# Patient Record
Sex: Male | Born: 1935 | Race: White | Hispanic: No | Smoking: Former smoker
Health system: Southern US, Community
[De-identification: ages and names within clinical notes are randomized; demographics above are authoritative.]

## PROBLEM LIST (undated history)

## (undated) DIAGNOSIS — I4891 Unspecified atrial fibrillation: Secondary | ICD-10-CM

## (undated) DIAGNOSIS — G20 Parkinson's disease: Secondary | ICD-10-CM

## (undated) DIAGNOSIS — K219 Gastro-esophageal reflux disease without esophagitis: Secondary | ICD-10-CM

## (undated) DIAGNOSIS — G459 Transient cerebral ischemic attack, unspecified: Secondary | ICD-10-CM

---

## 2014-11-09 LAB — COMPREHENSIVE METABOLIC PANEL
ANION GAP: 6 — AB (ref 7–16)
AST: 37 U/L (ref 15–37)
Albumin: 3.7 g/dL (ref 3.4–5.0)
Alkaline Phosphatase: 92 U/L
BUN: 22 mg/dL — ABNORMAL HIGH (ref 7–18)
Bilirubin,Total: 0.6 mg/dL (ref 0.2–1.0)
CALCIUM: 8.6 mg/dL (ref 8.5–10.1)
Chloride: 111 mmol/L — ABNORMAL HIGH (ref 98–107)
Co2: 23 mmol/L (ref 21–32)
Creatinine: 1.15 mg/dL (ref 0.60–1.30)
EGFR (African American): >60
EGFR (Non-African Amer.): >60
Glucose: 124 mg/dL — ABNORMAL HIGH (ref 65–99)
Osmolality: 284 (ref 275–301)
POTASSIUM: 4.9 mmol/L (ref 3.5–5.1)
SGPT (ALT): 13 U/L — ABNORMAL LOW
SODIUM: 140 mmol/L (ref 136–145)
Total Protein: 7.5 g/dL (ref 6.4–8.2)

## 2014-11-09 LAB — CBC
HCT: 45.7 % (ref 40.0–52.0)
HGB: 14.8 g/dL (ref 13.0–18.0)
MCH: 30.4 pg (ref 26.0–34.0)
MCHC: 32.4 g/dL (ref 32.0–36.0)
MCV: 94 fL (ref 80–100)
Platelet: 199 10*3/uL (ref 150–440)
RBC: 4.88 10*6/uL (ref 4.40–5.90)
RDW: 14.2 % (ref 11.5–14.5)
WBC: 9.2 10*3/uL (ref 3.8–10.6)

## 2014-11-09 LAB — URINALYSIS, COMPLETE
BLOOD: NEGATIVE
Bacteria: NONE SEEN
Bilirubin,UR: NEGATIVE
Glucose,UR: NEGATIVE mg/dL (ref 0–75)
LEUKOCYTE ESTERASE: NEGATIVE
Nitrite: NEGATIVE
PH: 5 (ref 4.5–8.0)
Protein: NEGATIVE
RBC,UR: 2 /HPF (ref 0–5)
Specific Gravity: 1.025 (ref 1.003–1.030)
WBC UR: 1 /HPF (ref 0–5)

## 2014-11-09 LAB — TROPONIN I

## 2014-11-10 LAB — CK-MB
CK-MB: 2.2 ng/mL (ref 0.5–3.6)
CK-MB: 2 ng/mL (ref 0.5–3.6)
CK-MB: 1.8 ng/mL (ref 0.5–3.6)

## 2014-11-10 LAB — TROPONIN I

## 2014-11-10 LAB — TSH: THYROID STIMULATING HORM: 2.4 u[IU]/mL

## 2014-11-11 ENCOUNTER — Inpatient Hospital Stay: Payer: Self-pay | Admitting: Internal Medicine

## 2014-11-11 LAB — COMPREHENSIVE METABOLIC PANEL
ALK PHOS: 83 U/L (ref 46–116)
Albumin: 3.5 g/dL (ref 3.4–5.0)
Anion Gap: 3 — ABNORMAL LOW (ref 7–16)
BILIRUBIN TOTAL: 0.7 mg/dL (ref 0.2–1.0)
BUN: 23 mg/dL — AB (ref 7–18)
CHLORIDE: 110 mmol/L — AB (ref 98–107)
CO2: 27 mmol/L (ref 21–32)
Calcium, Total: 8.8 mg/dL (ref 8.5–10.1)
Creatinine: 1.16 mg/dL (ref 0.60–1.30)
EGFR (Non-African Amer.): >60
Glucose: 90 mg/dL (ref 65–99)
Osmolality: 283 (ref 275–301)
POTASSIUM: 3.8 mmol/L (ref 3.5–5.1)
SGOT(AST): 31 U/L (ref 15–37)
SGPT (ALT): 11 U/L — ABNORMAL LOW (ref 14–63)
SODIUM: 140 mmol/L (ref 136–145)
TOTAL PROTEIN: 7 g/dL (ref 6.4–8.2)

## 2014-11-11 LAB — CBC WITH DIFFERENTIAL/PLATELET
Basophil #: 0 10*3/uL
Basophil %: 0.5 %
Eosinophil #: 0.1 10*3/uL
Eosinophil %: 1.7 %
HCT: 44.5 %
HGB: 14.2 g/dL
Lymphocyte %: 18.6 %
Lymphs Abs: 1.4 10*3/uL
MCH: 29.7 pg
MCHC: 31.9 g/dL — ABNORMAL LOW
MCV: 93 fL
Monocyte #: 0.6 10*3/uL
Monocyte %: 8.1 %
Neutrophil #: 5.3 10*3/uL
Neutrophil %: 71.1 %
Platelet: 182 10*3/uL
RBC: 4.77 x10 6/mm 3
RDW: 14.3 %
WBC: 7.4 10*3/uL

## 2014-11-11 LAB — MAGNESIUM: Magnesium: 2.1 mg/dL

## 2014-12-22 ENCOUNTER — Encounter
Admit: 2014-12-22 | Disposition: A | Discharge status: Home / Self Care | Payer: Self-pay | Attending: Internal Medicine | Admitting: Internal Medicine

## 2015-01-16 ENCOUNTER — Encounter
Admit: 2015-01-16 | Disposition: A | Discharge status: Home / Self Care | Payer: Self-pay | Attending: Internal Medicine | Admitting: Internal Medicine

## 2015-02-03 ENCOUNTER — Emergency Department
Admit: 2015-02-03 | Disposition: A | Discharge status: Home / Self Care | Payer: Self-pay | Admitting: Emergency Medicine

## 2015-02-03 LAB — CBC WITH DIFFERENTIAL/PLATELET
BASOS PCT: 0.4 %
Basophil #: 0 10*3/uL (ref 0.0–0.1)
EOS ABS: 0.1 10*3/uL (ref 0.0–0.7)
EOS PCT: 1.1 %
HCT: 43.5 % (ref 40.0–52.0)
HGB: 14.4 g/dL (ref 13.0–18.0)
Lymphocyte #: 0.8 10*3/uL — ABNORMAL LOW (ref 1.0–3.6)
Lymphocyte %: 10.3 %
MCH: 31 pg (ref 26.0–34.0)
MCHC: 33.2 g/dL (ref 32.0–36.0)
MCV: 93 fL (ref 80–100)
Monocyte #: 0.6 x10 3/mm (ref 0.2–1.0)
Monocyte %: 7.7 %
NEUTROS ABS: 5.9 10*3/uL (ref 1.4–6.5)
Neutrophil %: 80.5 %
Platelet: 186 10*3/uL (ref 150–440)
RBC: 4.66 10*6/uL (ref 4.40–5.90)
RDW: 16.3 % — AB (ref 11.5–14.5)
WBC: 7.3 10*3/uL (ref 3.8–10.6)

## 2015-02-03 LAB — BASIC METABOLIC PANEL
ANION GAP: 6 — AB (ref 7–16)
BUN: 20 mg/dL
CALCIUM: 8.5 mg/dL — AB
CHLORIDE: 104 mmol/L
Co2: 25 mmol/L
Creatinine: 0.85 mg/dL
EGFR (African American): >60
EGFR (Non-African Amer.): >60
GLUCOSE: 96 mg/dL
POTASSIUM: 3.9 mmol/L
SODIUM: 135 mmol/L

## 2015-02-03 LAB — URINALYSIS, COMPLETE
BACTERIA: NONE SEEN
BILIRUBIN, UR: NEGATIVE
BLOOD: NEGATIVE
Glucose,UR: NEGATIVE mg/dL (ref 0–75)
Leukocyte Esterase: NEGATIVE
Nitrite: NEGATIVE
Ph: 6 (ref 4.5–8.0)
SPECIFIC GRAVITY: 1.023 (ref 1.003–1.030)

## 2015-02-03 LAB — TROPONIN I: Troponin-I: <0.03 ng/mL

## 2015-02-03 LAB — MAGNESIUM: Magnesium: 2.1 mg/dL

## 2015-02-15 NOTE — H&P (Signed)
PATIENT NAME:  Jon Mills, Jon Mills MR#:  [id number] DATE OF BIRTH:  08-Sep-1931  DATE OF ADMISSION:  11/09/2014  PRIMARY CARE PHYSICIAN:  Jon Lope. Judithann Sheen, MD  REFERRING PHYSICIAN:  Kathreen Devoid. Paduchowski, MD  CHIEF COMPLAINT:  Altered mental status, slurred speech.  HISTORY OF PRESENT ILLNESS:  Jon Mills is an 81 year old male with known history of Parkinson disease on medications, who was doing well in his usual state of health until this afternoon, when the patient's daughter noticed him to be confused with some slurred speech. These symptoms gradually worsened over the course of the day. Concerning this, the patient's daughter brought him to the Emergency Department. Workup in the Emergency Department:  CT of the head without contrast is negative. The patient's symptoms have completely resolved. He is currently oriented x 3. The patient was recently seen by the neurologist and was noted to have atrial fibrillation, which was rate controlled at that time. The patient was recommended to follow up with the cardiologist as an outpatient. Today, the patient is found to have an atrial fibrillation with a rapid ventricular rate with heart rate around 110 to 120s. As per the patient's daughter, the patient has been experiencing mild shortness of breath lately with mild increased swelling in the lower extremities. The patient denied having any chest pain and denied any weakness in any part of the body.   PAST MEDICAL HISTORY:  Parkinson disease.  PAST SURGICAL HISTORY:  Hemorrhoidectomy.  ALLERGIES:  No known drug allergies.  HOME MEDICATIONS:   1.  Sinemet 25/250 mg 5 times a day. 2.  Mirapex ER 1.5 mg once a day. 3.  Comtan 200 mg 5 times a day. 4.  Aleve 25/220 mg 2 tablets once at bedtime.  SOCIAL HISTORY:  No history of smoking, drinking alcohol, or using illicit drugs. Currently lives in daughter's mother-in-law suite, which is connected to her house. Independent of ADLs.   FAMILY HISTORY:   Noncontributory at the age of 65.  REVIEW OF SYSTEMS: CONSTITUTIONAL:  Denies any generalized weakness. EYES:  No change in vision. EARS, NOSE, AND THROAT:  No change in hearing. RESPIRATORY:  No cough or shortness of breath. CARDIOVASCULAR:  No chest pain or palpitations. GASTROINTESTINAL:  No nausea or vomiting. GENITOURINARY:  No dysuria or hematuria. HEMATOLOGIC:  No easy bruising or bleeding. SKIN:  No rash or lesions. MUSCULOSKELETAL:  No joint pains and aches. NEUROLOGIC:  No weakness or numbness in any part of the body.  PHYSICAL EXAMINATION: GENERAL:  This is a well-built, well-nourished, age-appropriate male lying down in the bed, not in distress. VITAL SIGNS:  Temperature 98.2, pulse 115, blood pressure 155/99, respiratory rate 18, oxygen saturation 99% on room air. HEENT:  Head is normocephalic and atraumatic. There is no scleral icterus. Conjunctivae are normal. Pupils are equal, round, and react to light. Mucous membranes are moist. No pharyngeal erythema. NECK:  Supple. No lymphadenopathy. No JVD. No carotid bruit. CHEST:  Has no focal tenderness.  LUNGS:  Bilaterally clear to auscultation. HEART:  S1 and S2, regular. No murmurs are heard. ABDOMEN:  Bowel sounds present. Soft, nontender, nondistended. No hepatosplenomegaly.  EXTREMITIES:  Trace pitting edema around the ankles. Pulses 2+. NEUROLOGIC:  The patient is alert and oriented to place, person, and time. Cranial nerves II through XII are intact. Motor is 5/5 in upper and lower extremities.  LABORATORY DATA:  UA is negative for nitrites and leukocyte esterase.  CBC and BMP are completely within normal limits. Troponins are negative.  ASSESSMENT AND PLAN:  Jon Mills is an 81 year old male with known history of Parkinson disease, who comes with confusion and some slurred speech, which has been resolved at this time.  1.  Altered mental status, differential diagnosis transient ischemic attack versus arrhythmias.  Admit the patient to a monitored bed. Will obtain MRI of the brain in the morning, carotid Dopplers, and echocardiogram.  2.  Atrial fibrillation with rapid ventricular rate. Will obtain TSH and echocardiogram. I will keep the patient on metoprolol 25 mg b.i.d. 3.  Parkinson disease. Will continue the Sinemet. 4.  Keep the patient on deep venous thrombosis prophylaxis with Lovenox.  TIME SPENT:  50 minutes.   ____________________________ Jon GriffinsPadmaja Travez Stancil, MD pv:nb D: 11/10/2014 00:27:36 ET T: 11/10/2014 00:57:56 ET JOB#: [id number]  cc: Jon GriffinsPadmaja Yaneliz Radebaugh, MD, <Dictator> Jon GriffinsPADMAJA Rose-Marie Hickling MD ELECTRONICALLY SIGNED 11/11/2014 4:33

## 2015-02-15 NOTE — Consult Note (Signed)
PATIENT NAME:  Jon GreavesVEROSKO, Deionte F MR#:  [id number] DATE OF BIRTH:  [birth year]  DATE OF CONSULTATION:  11/10/2014  REFERRING PHYSICIAN:   CONSULTING PHYSICIAN:  Lamar BlinksBruce J. Folashade Gamboa, MD  CONSULTING PHYSICIAN:  Aram BeechamJeffrey Sparks, MD.    REASON FOR CONSULTATION: Atrial fibrillation with rapid ventricular rate.   CHIEF COMPLAINT:    dizzy.    HISTORY OF PRESENT ILLNESS:  This is an 81 year old male with known mild dementia and Parkinson's, who has not had any cardiovascular disease history. The patient has had new onset of dizziness and weakness for which he has had some confusion as well worse than before, The patient was seen in the Emergency Room for this issue, for which he had an EKG showing atrial fibrillation with rapid ventricular rate and nonspecific ST changes and some preventricular contractions. He has had some medications for treatment of this including metoprolol and Cardizem, for which his heart rate is 93 beats per minute and still irregular. He has not had any chest pain or other heart failure-type symptoms with a normal troponin and no evidence of EKG changes consistent with myocardial infarction. The patient has had an echocardiogram showing mild global LV systolic dysfunction most consistent with atrial fibrillation with rapid ventricular rate and some mild to moderate valvular heart disease. The patient is feeling somewhat better without evidence of further symptoms. He is ambulating well and no current concerns.   REVIEW OF SYSTEMS: The remainder review of systems difficult due to Parkinson's and mild dementia.   PAST MEDICAL HISTORY:  1.  Parkinsonism.  2.  Dementia.   FAMILY HISTORY: No family members with early onset of cardiovascular disease or hypertension.   SOCIAL HISTORY: Currently denies alcohol or tobacco use.   ALLERGIES: AS LISTED.   MEDICATIONS: As listed.   PHYSICAL EXAMINATION:  VITAL SIGNS: Blood pressure is 122/68 bilaterally, heart rate is 93 upright,  reclining, and irregular.  GENERAL: He is a well-appearing male in no acute distress.  HEAD, EYES, EARS, NOSE, AND THROAT: No icterus, thyromegaly, ulcers, hemorrhage, or xanthelasma.  CARDIOVASCULAR: Irregularly irregular with normal S1 and S2 with no murmur, gallop, or rub. PMI is inferiorly displaced. Carotid upstroke normal without bruit. Jugular venous pressure is normal.  LUNGS: Clear to auscultation with normal respirations.  ABDOMEN: Soft, nontender, without hepatosplenomegaly or masses. Abdominal aorta is normal size without bruit.  EXTREMITIES: 2 + radial, femoral, dorsal pedal pulses, with trace lower extremity edema. No cyanosis, clubbing, or ulcers.  NEUROLOGIC: He appears to be oriented.   ASSESSMENT: An 81 year old male with new onset atrial fibrillation with rapid ventricular rate, now controlled, nonvalvular in nature, with mild global left ventricular systolic dysfunction and nonrheumatic valvular heart disease, needing further treatment options.   RECOMMENDATIONS:  1.  Continue combination of Cardizem and metoprolol for heart rate control of atrial fibrillation with a goal heart rate at rest between 60-90 beats per minute.  2.  Begin anticoagulation with Eliquis 5 mg twice per day for further risk reduction in stroke, with further discussion with the patient about the risks and benefits of anticoagulation and bleeding risks. The patient does not have any apparent significant bleeding risks at this time and his fall risk is minimal. The family agrees with this treatment.  3.  Continue ambulation, following for any further significant symptoms.  4.  Continue metoprolol for mild LV systolic dysfunction most consistent with atrial fibrillation with rapid ventricular rate.  5.  Okay for discharge to home with followup thereafter with adjustments of medications.  ____________________________ Lamar Blinks, MD bjk:bu D: 11/10/2014 17:03:21 ET T: 11/10/2014 17:20:46  ET JOB#: [id number]  cc: Lamar Blinks, MD, <Dictator> Lamar Blinks MD ELECTRONICALLY SIGNED 11/14/2014 8:36

## 2015-02-15 NOTE — Discharge Summary (Signed)
PATIENT NAME:  Jon Mills, Jon Mills MR#:  [id number] DATE OF BIRTH:  Jun 11, 1931  DATE OF ADMISSION:  11/11/2014 DATE OF DISCHARGE:  11/12/2014  REASON FOR ADMISSION: Rapid atrial fibrillation with altered mental status and slurred speech.   HISTORY OF PRESENT ILLNESS: Please see the dictated HPI done by Dr. Heron NayVasireddy on 11/10/2014.   PAST MEDICAL HISTORY:  1.  Parkinson disease.  2.  Status post hemorrhoidectomy.   MEDICATIONS ON ADMISSION: Please see admission note.   ALLERGIES: No known drug allergies.   SOCIAL HISTORY: As per admission note.   FAMILY HISTORY: As per admission note.   REVIEW OF SYSTEMS: As per admission note.   PHYSICAL EXAMINATION:  GENERAL: The patient was in no acute distress.  VITAL SIGNS: Stable and he was afebrile.  HEENT: Unremarkable.  NECK: Supple without JVD.  LUNGS: Clear.  CARDIAC: Regular rate and rhythm with a normal S1, S2.  ABDOMEN: Soft and nontender.  EXTREMITIES: Reveal trace pitting edema.  NEUROLOGIC: Grossly nonfocal.   HOSPITAL COURSE: The patient was admitted with altered mental status with possible TIA. He was also noted to have transient atrial fibrillation with a rapid ventricular response. Echocardiogram, TSH, and cardiac enzymes were unremarkable. He was seen in consultation by cardiology who recommended conservative therapy. He was placed on blood thinners because of the atrial fibrillation. MRI of the brain was unremarkable. He was felt to have a TIA. He was seen by physical therapy and care management who recommended home health. The family agreed. The patient's rate was well controlled and he was ready for discharge on 11/12/2014.   DISCHARGE DIAGNOSES:  1.  Rapid atrial fibrillation.  2.  Transient ischemic attack.  3.  Benign hypertension.  4.  Parkinson disease.   DISCHARGE MEDICATIONS:  1.  Mirapex ER 1.5 mg p.o. daily.  2.  Sinemet 25/250 one p.o. t.i.d.  3.  Entacapone 200 mg p.o. 5 times daily. 4.  Norco 10/325 one  p.o. q. 4 hours p.r.n. pain.  5.  Imdur 30 mg p.o. b.i.d.  6.  Eliquis 5 mg p.o. b.i.d.  7.  Lopressor 50 mg p.o. b.i.d.  8.  Cardizem CD 180 mg p.o. daily.  9.  Protonix 40 mg p.o. b.i.d.   FOLLOW UP PLANS AND APPOINTMENTS: The patient was discharged on a low sodium diet. He will be followed by home health. He will follow up with me within 1 week's time, sooner if needed.     ____________________________ Duane LopeJeffrey D. Judithann SheenSparks, MD jds:TT D: 11/18/2014 17:15:12 ET T: 11/18/2014 21:25:50 ET JOB#: [id number]  cc: Duane LopeJeffrey D. Judithann SheenSparks, MD, <Dictator> Maybell Misenheimer Rodena Medin Malakhai Beitler MD ELECTRONICALLY SIGNED 11/19/2014 8:12

## 2015-07-02 ENCOUNTER — Emergency Department: Payer: Medicare Other

## 2015-07-02 ENCOUNTER — Observation Stay
Admission: EM | Admit: 2015-07-02 | Discharge: 2015-07-03 | Disposition: A | Discharge status: Home / Self Care | Payer: Medicare Other | Attending: Internal Medicine | Admitting: Internal Medicine

## 2015-07-02 DIAGNOSIS — G459 Transient cerebral ischemic attack, unspecified: Principal | ICD-10-CM | POA: Insufficient documentation

## 2015-07-02 DIAGNOSIS — Z7901 Long term (current) use of anticoagulants: Secondary | ICD-10-CM | POA: Insufficient documentation

## 2015-07-02 DIAGNOSIS — I482 Chronic atrial fibrillation: Secondary | ICD-10-CM | POA: Diagnosis not present

## 2015-07-02 DIAGNOSIS — I639 Cerebral infarction, unspecified: Secondary | ICD-10-CM

## 2015-07-02 DIAGNOSIS — R262 Difficulty in walking, not elsewhere classified: Secondary | ICD-10-CM | POA: Diagnosis not present

## 2015-07-02 DIAGNOSIS — R4701 Aphasia: Secondary | ICD-10-CM | POA: Insufficient documentation

## 2015-07-02 DIAGNOSIS — R4789 Other speech disturbances: Secondary | ICD-10-CM | POA: Insufficient documentation

## 2015-07-02 DIAGNOSIS — I340 Nonrheumatic mitral (valve) insufficiency: Secondary | ICD-10-CM | POA: Diagnosis not present

## 2015-07-02 DIAGNOSIS — R471 Dysarthria and anarthria: Secondary | ICD-10-CM | POA: Insufficient documentation

## 2015-07-02 DIAGNOSIS — Z87891 Personal history of nicotine dependence: Secondary | ICD-10-CM | POA: Diagnosis not present

## 2015-07-02 DIAGNOSIS — R4182 Altered mental status, unspecified: Secondary | ICD-10-CM | POA: Insufficient documentation

## 2015-07-02 DIAGNOSIS — Z8673 Personal history of transient ischemic attack (TIA), and cerebral infarction without residual deficits: Secondary | ICD-10-CM | POA: Insufficient documentation

## 2015-07-02 DIAGNOSIS — Z7982 Long term (current) use of aspirin: Secondary | ICD-10-CM | POA: Insufficient documentation

## 2015-07-02 DIAGNOSIS — Z79899 Other long term (current) drug therapy: Secondary | ICD-10-CM | POA: Diagnosis not present

## 2015-07-02 DIAGNOSIS — I10 Essential (primary) hypertension: Secondary | ICD-10-CM | POA: Diagnosis not present

## 2015-07-02 DIAGNOSIS — G20 Parkinson's disease: Secondary | ICD-10-CM | POA: Insufficient documentation

## 2015-07-02 DIAGNOSIS — I071 Rheumatic tricuspid insufficiency: Secondary | ICD-10-CM | POA: Diagnosis not present

## 2015-07-02 HISTORY — DX: Parkinson's disease: G20

## 2015-07-02 HISTORY — DX: Unspecified atrial fibrillation: I48.91

## 2015-07-02 HISTORY — DX: Transient cerebral ischemic attack, unspecified: G45.9

## 2015-07-02 LAB — COMPREHENSIVE METABOLIC PANEL WITH GFR
ALT: <5 U/L — ABNORMAL LOW (ref 17–63)
AST: 14 U/L — ABNORMAL LOW (ref 15–41)
Albumin: 3.9 g/dL (ref 3.5–5.0)
Alkaline Phosphatase: 113 U/L (ref 38–126)
Anion gap: 8 (ref 5–15)
BUN: 21 mg/dL — ABNORMAL HIGH (ref 6–20)
CO2: 27 mmol/L (ref 22–32)
Calcium: 9.1 mg/dL (ref 8.9–10.3)
Chloride: 102 mmol/L (ref 101–111)
Creatinine, Ser: 0.98 mg/dL (ref 0.61–1.24)
GFR calc Af Amer: >60 mL/min
GFR calc non Af Amer: >60 mL/min
Glucose, Bld: 161 mg/dL — ABNORMAL HIGH (ref 65–99)
Potassium: 4.1 mmol/L (ref 3.5–5.1)
Sodium: 137 mmol/L (ref 135–145)
Total Bilirubin: 0.5 mg/dL (ref 0.3–1.2)
Total Protein: 7.7 g/dL (ref 6.5–8.1)

## 2015-07-02 LAB — APTT: aPTT: 38 s — ABNORMAL HIGH (ref 24–36)

## 2015-07-02 LAB — CBC
HEMATOCRIT: 45.5 % (ref 40.0–52.0)
HEMOGLOBIN: 15.2 g/dL (ref 13.0–18.0)
MCH: 31 pg (ref 26.0–34.0)
MCHC: 33.3 g/dL (ref 32.0–36.0)
MCV: 93.2 fL (ref 80.0–100.0)
Platelets: 232 10*3/uL (ref 150–440)
RBC: 4.88 MIL/uL (ref 4.40–5.90)
RDW: 14.3 % (ref 11.5–14.5)
WBC: 8.6 10*3/uL (ref 3.8–10.6)

## 2015-07-02 LAB — PROTIME-INR
INR: 1.18
Prothrombin Time: 15.2 seconds — ABNORMAL HIGH (ref 11.4–15.0)

## 2015-07-02 LAB — DIFFERENTIAL
BASOS ABS: 0 10*3/uL (ref 0–0.1)
Basophils Relative: 0 %
EOS ABS: 0.1 10*3/uL (ref 0–0.7)
Eosinophils Relative: 2 %
LYMPHS ABS: 1.7 10*3/uL (ref 1.0–3.6)
LYMPHS PCT: 20 %
MONOS PCT: 9 %
Monocytes Absolute: 0.8 10*3/uL (ref 0.2–1.0)
NEUTROS ABS: 6 10*3/uL (ref 1.4–6.5)
Neutrophils Relative %: 69 %

## 2015-07-02 LAB — TROPONIN I: Troponin I: <0.03 ng/mL

## 2015-07-02 MED ORDER — METOPROLOL TARTRATE 25 MG PO TABS
25.0000 mg | ORAL_TABLET | Freq: Two times a day (BID) | ORAL | Status: DC
  Administered 2015-07-03 (×2): 25 mg via ORAL
  Filled 2015-07-02 (×2): qty 1

## 2015-07-02 MED ORDER — ACETAMINOPHEN 325 MG PO TABS: 650.0000 mg | ORAL_TABLET | Freq: Four times a day (QID) | ORAL | Status: DC | PRN

## 2015-07-02 MED ORDER — CARBIDOPA-LEVODOPA ER 25-100 MG PO TBCR
1.0000 | EXTENDED_RELEASE_TABLET | Freq: Every day | ORAL | Status: DC
  Administered 2015-07-03 (×2): 1 via ORAL
  Filled 2015-07-02 (×6): qty 1

## 2015-07-02 MED ORDER — APIXABAN 5 MG PO TABS
5.0000 mg | ORAL_TABLET | Freq: Two times a day (BID) | ORAL | Status: DC
  Administered 2015-07-03 (×2): 5 mg via ORAL
  Filled 2015-07-02 (×2): qty 1

## 2015-07-02 MED ORDER — DILTIAZEM HCL ER COATED BEADS 180 MG PO CP24
180.0000 mg | ORAL_CAPSULE | Freq: Every day | ORAL | Status: DC
  Administered 2015-07-03: 11:00:00 180 mg via ORAL
  Filled 2015-07-02: qty 1

## 2015-07-02 MED ORDER — CARBIDOPA-LEVODOPA ER 50-200 MG PO TBCR
1.0000 | EXTENDED_RELEASE_TABLET | Freq: Every day | ORAL | Status: DC
  Administered 2015-07-03: 03:00:00 1 via ORAL
  Filled 2015-07-02 (×2): qty 1

## 2015-07-02 MED ORDER — DOCUSATE SODIUM 100 MG PO CAPS
100.0000 mg | ORAL_CAPSULE | Freq: Two times a day (BID) | ORAL | Status: DC
  Administered 2015-07-03 (×2): 100 mg via ORAL
  Filled 2015-07-02 (×2): qty 1

## 2015-07-02 MED ORDER — QUETIAPINE FUMARATE 25 MG PO TABS
12.5000 mg | ORAL_TABLET | Freq: Every day | ORAL | Status: DC
  Administered 2015-07-03: 25 mg via ORAL
  Filled 2015-07-02: qty 1

## 2015-07-02 MED ORDER — DIPHENHYDRAMINE-APAP (SLEEP) 25-500 MG PO TABS: 1.0000 | ORAL_TABLET | Freq: Every evening | ORAL | Status: DC | PRN

## 2015-07-02 MED ORDER — ACETAMINOPHEN 500 MG PO TABS: 500.0000 mg | ORAL_TABLET | Freq: Every evening | ORAL | Status: DC | PRN

## 2015-07-02 MED ORDER — CARBIDOPA-LEVODOPA 25-250 MG PO TABS
1.0000 | ORAL_TABLET | Freq: Every day | ORAL | Status: DC
  Administered 2015-07-03 (×2): 1 via ORAL
  Filled 2015-07-02 (×6): qty 1

## 2015-07-02 MED ORDER — ACETAMINOPHEN 650 MG RE SUPP: 650.0000 mg | Freq: Four times a day (QID) | RECTAL | Status: DC | PRN

## 2015-07-02 MED ORDER — ASPIRIN EC 325 MG PO TBEC
325.0000 mg | DELAYED_RELEASE_TABLET | Freq: Every day | ORAL | Status: DC
  Administered 2015-07-02 – 2015-07-03 (×2): 325 mg via ORAL
  Filled 2015-07-02 (×2): qty 1

## 2015-07-02 MED ORDER — CARBIDOPA-LEVODOPA ER 50-200 MG PO TBCR
2.0000 | EXTENDED_RELEASE_TABLET | Freq: Every day | ORAL | Status: DC
  Administered 2015-07-03: 01:00:00 2 via ORAL
  Filled 2015-07-02 (×2): qty 2

## 2015-07-02 MED ORDER — SODIUM CHLORIDE 0.9 % IJ SOLN
3.0000 mL | Freq: Two times a day (BID) | INTRAMUSCULAR | Status: DC
  Administered 2015-07-03: 01:00:00 3 mL via INTRAVENOUS

## 2015-07-02 MED ORDER — ONDANSETRON HCL 4 MG PO TABS: 4.0000 mg | ORAL_TABLET | Freq: Four times a day (QID) | ORAL | Status: DC | PRN

## 2015-07-02 MED ORDER — DIPHENHYDRAMINE HCL 25 MG PO CAPS: 25.0000 mg | ORAL_CAPSULE | Freq: Every evening | ORAL | Status: DC | PRN

## 2015-07-02 MED ORDER — ONDANSETRON HCL 4 MG/2ML IJ SOLN: 4.0000 mg | Freq: Four times a day (QID) | INTRAMUSCULAR | Status: DC | PRN

## 2015-07-02 NOTE — ED Notes (Signed)
Pt sitting up in chair at bedside. Family remains at bedside. NAD noted. RR even and nonlabored. Will continue to monitor.

## 2015-07-02 NOTE — ED Provider Notes (Signed)
[facility] Emergency Department Provider Note  ____________________________________________  Time seen: Approximately 8:54 PM  I have reviewed the triage vital signs and the nursing notes.   HISTORY  Chief Complaint Altered Mental Status and Code Stroke    HPI Jon Mills is a 81 y.o. male daughter reports she was talking to her father today at 6:30. He became acutely unable to answer questions except for by saying yes or no. On the way here he began being able to respond appropriately again.   No past medical history on file.  past medical history significant for Parkinson's and high blood pressure and atrial fibrillation  There are no active problems to display for this patient.   No past surgical history on file.  Current Outpatient Rx  Name  Route  Sig  Dispense  Refill  . apixaban (ELIQUIS) 5 MG TABS tablet   Oral   Take 5 mg by mouth 2 (two) times daily.         . carbidopa-levodopa (SINEMET CR) 50-200 MG per tablet   Oral   Take 2-3 tablets by mouth at bedtime.         . carbidopa-levodopa (SINEMET IR) 25-250 MG per tablet   Oral   Take 1 tablet by mouth 5 (five) times daily. May take an additional 1/2 at bedtime if needed         . Carbidopa-Levodopa ER (SINEMET CR) 25-100 MG tablet controlled release   Oral   Take 1 tablet by mouth 5 (five) times daily. This is to be taken with the 25-250 dose         . diltiazem (TIAZAC) 180 MG 24 hr capsule   Oral   Take 180 mg by mouth daily.         . diphenhydramine-acetaminophen (TYLENOL PM) 25-500 MG TABS   Oral   Take 1 tablet by mouth at bedtime as needed.         . metoprolol tartrate (LOPRESSOR) 25 MG tablet   Oral   Take 25 mg by mouth 2 (two) times daily.         . QUEtiapine (SEROQUEL) 25 MG tablet   Oral   Take 12.5-25 mg by mouth at bedtime.           Allergies Review of patient's allergies indicates no known allergies.  No family history on  file.  Social History Social History  Substance Use Topics  . Smoking status: Not on file  . Smokeless tobacco: Not on file  . Alcohol Use: Not on file    Review of Systems Constitutional: No fever/chills Eyes: No visual changes. ENT: No sore throat. Cardiovascular: Denies chest pain. Respiratory: Denies shortness of breath. Gastrointestinal: No abdominal pain.  No nausea, no vomiting.  No diarrhea.  No constipation. Genitourinary: Negative for dysuria. Musculoskeletal: Negative for back pain. Skin: Negative for rash. Neurological: Negative for headaches, focal weakness or numbness. At present at home patient had trouble lifting his right leg  10-point ROS otherwise negative.  ____________________________________________   PHYSICAL EXAM:  VITAL SIGNS: ED Triage Vitals  Enc Vitals Group     BP 07/02/15 1924 157/70 mmHg     Pulse Rate 07/02/15 1924 78     Resp 07/02/15 1924 18     Temp 07/02/15 1924 98.3 F (36.8 C)     Temp Source 07/02/15 1924 Oral     SpO2 07/02/15 1924 97 %     Weight 07/02/15 1929 194 lb (87.998  kg)     Height 07/02/15 1924  (1.753 m)     Head Cir --      Peak Flow --      Pain Score --      Pain Loc --      Pain Edu? --      Excl. in GC? --     Constitutional: Alert and oriented. Well appearing and in no acute distress. Eyes: Conjunctivae are normal. PERRL. EOMI. Head: Atraumatic. Slight right facial droop which is old patient also has some tremors of the mouth which almost look like tardive Dyskinesia these are also old Nose: No congestion/rhinnorhea. Mouth/Throat: Mucous membranes are moist.  Oropharynx non-erythematous. Neck: No stridor.   Cardiovascular: Normal rate, regular rhythm. Grossly normal heart sounds.  Good peripheral circulation. Respiratory: Normal respiratory effort.  No retractions. Lungs CTAB. Gastrointestinal: Soft and nontender. No distention. No abdominal bruits. No CVA tenderness. Musculoskeletal: No lower  extremity tenderness nor edema.  No joint effusions. Neurologic:  Normal speech and language. No gross new focal neurologic deficits are appreciated. No gait instability. Skin:  Skin is warm, dry and intact. No rash noted. Psychiatric: Mood and affect are normal. Speech and behavior are normal.  ____________________________________________   LABS (all labs ordered are listed, but only abnormal results are displayed)  Labs Reviewed  PROTIME-INR - Abnormal; Notable for the following:    Prothrombin Time 15.2 (*)    All other components within normal limits  APTT - Abnormal; Notable for the following:    aPTT 38 (*)    All other components within normal limits  COMPREHENSIVE METABOLIC PANEL - Abnormal; Notable for the following:    Glucose, Bld 161 (*)    BUN 21 (*)    AST 14 (*)    ALT <5 (*)    All other components within normal limits  CBC  DIFFERENTIAL  TROPONIN I   ____________________________________________  EKG  EKG read and interpreted by me shows atrial flutter at a rate of 79 normal axis no acute ST-T wave changes ____________________________________________  RADIOLOGY  CT of the head was read by radiology and shows no acute disease ____________________________________________   PROCEDURES    ____________________________________________   INITIAL IMPRESSION / ASSESSMENT AND PLAN / ED COURSE  Pertinent labs & imaging results that were available during my care of the patient were reviewed by me and considered in my medical decision making (see chart for details). Discussed with neurology who recommends to the patient's age and risk factors and the fact that the TIA somewhat different than previous ones be admitted to the hospital  ____________________________________________   FINAL CLINICAL IMPRESSION(S) / ED DIAGNOSES  Final diagnoses:  Transient cerebral ischemia, unspecified transient cerebral ischemia type      Arnaldo Natal, MD 07/02/15  2100

## 2015-07-02 NOTE — ED Notes (Signed)
Admitting MD at bedside for eval.

## 2015-07-02 NOTE — H&P (Signed)
[facility] -  at [facility]   PATIENT NAME: Jon Mills    MR#:  [id number]  DATE OF BIRTH:  1931-05-26  DATE OF ADMISSION:  07/02/2015  PRIMARY CARE PHYSICIAN: Marguarite Arbour, MD   REQUESTING/REFERRING PHYSICIAN: Malinda  CHIEF COMPLAINT:   dysarthria HISTORY OF PRESENT ILLNESS:  Jon Mills  is a 81 y.o. male with a known history of Parkinson's disease, TIA, hypertension and chronic atrial fibrillation is brought into the ED for dysarthria by his daughter. At around 6:30 PM tonight patient was unable to put the words together and make complete sentences. When the time he came into the ED he was back to his normal canals and questions appropriately. CT head was negative. Hospitalist team is called to admit the patient for TIA. During my examination patient is resting comfortably. Denies any headache or blurry vision. Denies any weakness in his extremities.  PAST MEDICAL HISTORY:  Parkinson's disease  Chronic atrial fibrillation TIA Hypertension  PAST SURGICAL HISTOIRY:  Denies any past surgical history  SOCIAL HISTORY:   Social History  Substance Use Topics  . Smoking status: Not on file  . Smokeless tobacco: Not on file  . Alcohol Use: Not on file   Lives with daughter FAMILY HISTORY:  History of hypertension  DRUG ALLERGIES:  No Known Allergies  REVIEW OF SYSTEMS:  CONSTITUTIONAL: No fever, fatigue or weakness.  EYES: No blurred or double vision.  EARS, NOSE, AND THROAT: No tinnitus or ear pain.  RESPIRATORY: No cough, shortness of breath, wheezing or hemoptysis.  CARDIOVASCULAR: No chest pain, orthopnea, edema.  GASTROINTESTINAL: No nausea, vomiting, diarrhea or abdominal pain.  GENITOURINARY: No dysuria, hematuria.  ENDOCRINE: No polyuria, nocturia,  HEMATOLOGY: No anemia, easy bruising or bleeding SKIN: No rash or lesion. MUSCULOSKELETAL: No joint pain or arthritis.   NEUROLOGIC: No tingling, numbness, weakness.   PSYCHIATRY: No anxiety or depression.   MEDICATIONS AT HOME:   Prior to Admission medications   Medication Sig Start Date End Date Taking? Authorizing Provider  apixaban (ELIQUIS) 5 MG TABS tablet Take 5 mg by mouth 2 (two) times daily.   Yes Historical Provider, MD  carbidopa-levodopa (SINEMET CR) 50-200 MG per tablet Take 2-3 tablets by mouth at bedtime.   Yes Historical Provider, MD  carbidopa-levodopa (SINEMET IR) 25-250 MG per tablet Take 1 tablet by mouth 5 (five) times daily. May take an additional 1/2 at bedtime if needed   Yes Historical Provider, MD  Carbidopa-Levodopa ER (SINEMET CR) 25-100 MG tablet controlled release Take 1 tablet by mouth 5 (five) times daily. This is to be taken with the 25-250 dose   Yes Historical Provider, MD  diltiazem (TIAZAC) 180 MG 24 hr capsule Take 180 mg by mouth daily.   Yes Historical Provider, MD  diphenhydramine-acetaminophen (TYLENOL PM) 25-500 MG TABS Take 1 tablet by mouth at bedtime as needed.   Yes Historical Provider, MD  metoprolol tartrate (LOPRESSOR) 25 MG tablet Take 25 mg by mouth 2 (two) times daily.   Yes Historical Provider, MD  QUEtiapine (SEROQUEL) 25 MG tablet Take 12.5-25 mg by mouth at bedtime.   Yes Historical Provider, MD      VITAL SIGNS:  Blood pressure 155/74, pulse 77, temperature 97.9 F (36.6 C), temperature source Oral, resp. rate 22, height 5\' 9"  (1.753 m), weight 87.998 kg (194 lb), SpO2 99 %.  PHYSICAL EXAMINATION:  GENERAL:  81 y.o.-year-old patient lying in the bed with no acute distress.  EYES: Pupils equal, round, reactive to  light and accommodation. No scleral icterus. Extraocular muscles intact.  HEENT: Head atraumatic, normocephalic. Oropharynx and nasopharynx clear. Lip smacking movements NECK:  Supple, no jugular venous distention. No thyroid enlargement, no tenderness.  LUNGS: Normal breath sounds bilaterally, no wheezing, rales,rhonchi or crepitation. No use of accessory muscles of respiration.   CARDIOVASCULAR: S1, S2 normal. No murmurs, rubs, or gallops.  ABDOMEN: Soft, nontender, nondistended. Bowel sounds present. No organomegaly or mass.  EXTREMITIES: No pedal edema, cyanosis, or clubbing.  NEUROLOGIC: Cranial nerves II through XII are intact. Muscle strength 5/5 in all extremities. Sensation intact. Gait not checked. Resting tremors PSYCHIATRIC: The patient is alert and oriented x 3.  SKIN: No obvious rash, lesion, or ulcer.   LABORATORY PANEL:   CBC  Recent Labs Lab 07/02/15 1945  WBC 8.6  HGB 15.2  HCT 45.5  PLT 232   ------------------------------------------------------------------------------------------------------------------  Chemistries   Recent Labs Lab 07/02/15 1945  NA 137  K 4.1  CL 102  CO2 27  GLUCOSE 161*  BUN 21*  CREATININE 0.98  CALCIUM 9.1  AST 14*  ALT <5*  ALKPHOS 113  BILITOT 0.5   ------------------------------------------------------------------------------------------------------------------  Cardiac Enzymes  Recent Labs Lab 07/02/15 1945  TROPONINI <0.03   ------------------------------------------------------------------------------------------------------------------  RADIOLOGY:  Ct Head Wo Contrast  07/02/2015   CLINICAL DATA:  Altered mental status with speech difficulty.  EXAM: CT HEAD WITHOUT CONTRAST  TECHNIQUE: Contiguous axial images were obtained from the base of the skull through the vertex without intravenous contrast.  COMPARISON:  02/03/2015 and 11/11/2014  FINDINGS: Ventricles, cisterns and other CSF spaces are within normal with minimal age related atrophic change. There is mild chronic ischemic microvascular disease present. There is no mass, mass effect, shift of midline structures or acute hemorrhage. There is no evidence suggest acute infarction. Remaining bones and soft tissues are within normal.  IMPRESSION: No acute intracranial findings.  Mild age related atrophy and chronic ischemic microvascular  disease.   Electronically Signed   By: Elberta Fortis M.D.   On: 07/02/2015 20:24    EKG:   Orders placed or performed during the hospital encounter of 07/02/15  . EKG 12-Lead  . EKG 12-Lead  . ED EKG  . ED EKG    IMPRESSION AND PLAN:   Holden Maniscalco  is a 80 y.o. male with a known history of Parkinson's disease, TIA, hypertension and chronic atrial fibrillation is brought into the ED for dysarthria by his daughter. At around 6:30 PM tonight patient was unable to put the words together and make complete sentences. When the time he came into the ED he was back to his normal canals and questions appropriately. CT head was negative.     1. TIA/CVA with dysarthria  Admit to off unit telemetry CT head is negative Will consider MRI of the brain in the patient is having symptoms still Mildred stroke workup with carotid Dopplers and 2-D echocardiogram Neuro checks Bedside swallow evaluation by RN and if patient fails bedside swallow evaluation will consider speech pathology consult and keep the patient nothing by mouth Check fasting lipid panel in a.m. Provide aspirin by mouth once daily Neurology consult is placed  2. Chronic history of atrial fibrillation rate controlled Continue his home medication Eliquis Continue diltiazem for rate control  3. Chronic history of hypertension Continue Cardizem and metoprolol  4. Past medical history of TIA Provide aspirin Continue home medication Eliquis     All the records are reviewed and case discussed with ED provider. Management plans  discussed with the patient, family and they are in agreement.  CODE STATUS: DO NOT RESUSCITATE, daughter is the healthcare power of attorney  TOTAL TIME TAKING CARE OF THIS PATIENT: 45 minutes.   More than 50% of the time was spent on coordination of care  Patient will be transferred to Dr. Aletha Halim service   Ramonita Lab M.D on 07/02/2015 at 9:59 PM  Between 7am to 6pm - Pager -  [phone]  After 6pm go to [email or web address] - password [email or web address] [facility]  [facility]  Office  [phone]  CC: Primary care physician; Marguarite Arbour, MD

## 2015-07-02 NOTE — ED Notes (Signed)
Patient transported to CT via stretcher.

## 2015-07-02 NOTE — ED Notes (Signed)
Pt returned from CT °

## 2015-07-02 NOTE — ED Notes (Signed)
Pt to ED with daughter c/o change in mental status from being coherent with speech to not being able to speak in complete sentences. Last known normal is 1830.

## 2015-07-03 ENCOUNTER — Observation Stay: Payer: Medicare Other

## 2015-07-03 ENCOUNTER — Observation Stay
Admit: 2015-07-03 | Discharge: 2015-07-03 | Disposition: A | Discharge status: Home / Self Care | Payer: Medicare Other | Attending: Internal Medicine | Admitting: Internal Medicine

## 2015-07-03 DIAGNOSIS — G459 Transient cerebral ischemic attack, unspecified: Secondary | ICD-10-CM | POA: Diagnosis not present

## 2015-07-03 LAB — CBC
HEMATOCRIT: 41.3 % (ref 40.0–52.0)
Hemoglobin: 13.8 g/dL (ref 13.0–18.0)
MCH: 30.6 pg (ref 26.0–34.0)
MCHC: 33.3 g/dL (ref 32.0–36.0)
MCV: 92.1 fL (ref 80.0–100.0)
Platelets: 224 10*3/uL (ref 150–440)
RBC: 4.49 MIL/uL (ref 4.40–5.90)
RDW: 14 % (ref 11.5–14.5)
WBC: 7.3 10*3/uL (ref 3.8–10.6)

## 2015-07-03 LAB — LIPID PANEL
CHOL/HDL RATIO: 5.2 ratio
CHOLESTEROL: 203 mg/dL — AB (ref 0–200)
HDL: 39 mg/dL — ABNORMAL LOW (ref >40)
LDL Cholesterol: 149 mg/dL — ABNORMAL HIGH (ref 0–99)
Triglycerides: 76 mg/dL (ref <150)
VLDL: 15 mg/dL (ref 0–40)

## 2015-07-03 LAB — BASIC METABOLIC PANEL
Anion gap: 6 (ref 5–15)
BUN: 16 mg/dL (ref 6–20)
CO2: 26 mmol/L (ref 22–32)
CREATININE: 0.77 mg/dL (ref 0.61–1.24)
Calcium: 8.7 mg/dL — ABNORMAL LOW (ref 8.9–10.3)
Chloride: 107 mmol/L (ref 101–111)
GFR calc Af Amer: >60 mL/min (ref >60)
GLUCOSE: 102 mg/dL — AB (ref 65–99)
Potassium: 3.7 mmol/L (ref 3.5–5.1)
SODIUM: 139 mmol/L (ref 135–145)

## 2015-07-03 LAB — TSH: TSH: 1.573 u[IU]/mL (ref 0.350–4.500)

## 2015-07-03 MED ORDER — ONDANSETRON HCL 4 MG PO TABS: 4.0000 mg | ORAL_TABLET | Freq: Four times a day (QID) | ORAL | Status: DC | PRN

## 2015-07-03 MED ORDER — ASPIRIN 325 MG PO TBEC: 325.0000 mg | DELAYED_RELEASE_TABLET | Freq: Every day | ORAL | Status: DC

## 2015-07-03 MED ORDER — DOCUSATE SODIUM 100 MG PO CAPS: 100.0000 mg | ORAL_CAPSULE | Freq: Two times a day (BID) | ORAL | Status: DC

## 2015-07-03 NOTE — Progress Notes (Signed)
Echocardiogram 2D Echocardiogram has been performed.  Jon Mills 07/03/2015, 10:07 AM

## 2015-07-03 NOTE — Consult Note (Signed)
Reason for Consult: stroke Referring Physician: Dr. Brunetta Genera is an 81 y.o. male.  HPI: seen at request of Dr. Doy Hutching for stroke;  81 yo RHD M presents to [facility] after a thirty minute episode of being unable to answer questions.  Pt was only able to answer yes or no but was able to follow all commands.  Per daughter, he was not able to use his R leg either.  Pt states that he knew that he wanted to talk but could not get his words out.  This has never happened before.  He sees Dr. Anna Genre as an outpatient for Parkinsons which he has no freezing and has had no medication adjustment in a few months.  Past Medical History  Diagnosis Date  . Parkinson's disease   . TIA (transient ischemic attack)   . A-fib     History reviewed. No pertinent past surgical history.  History reviewed. No pertinent family history.  Social History:  reports that he has quit smoking. He does not have any smokeless tobacco history on file. His alcohol and drug histories are not on file.  Allergies: No Known Allergies  Medications: personally reviewed by me as per chart  Results for orders placed or performed during the hospital encounter of 07/02/15 (from the past 48 hour(s))  Protime-INR     Status: Abnormal   Collection Time: 07/02/15  7:45 PM  Result Value Ref Range   Prothrombin Time 15.2 (H) 11.4 - 15.0 seconds   INR 1.18   APTT     Status: Abnormal   Collection Time: 07/02/15  7:45 PM  Result Value Ref Range   aPTT 38 (H) 24 - 36 seconds    Comment:        IF BASELINE aPTT IS ELEVATED, SUGGEST PATIENT RISK ASSESSMENT BE USED TO DETERMINE APPROPRIATE ANTICOAGULANT THERAPY.   CBC     Status: None   Collection Time: 07/02/15  7:45 PM  Result Value Ref Range   WBC 8.6 3.8 - 10.6 K/uL   RBC 4.88 4.40 - 5.90 MIL/uL   Hemoglobin 15.2 13.0 - 18.0 g/dL   HCT 45.5 40.0 - 52.0 %   MCV 93.2 80.0 - 100.0 fL   MCH 31.0 26.0 - 34.0 pg   MCHC 33.3 32.0 - 36.0 g/dL   RDW 14.3 11.5 - 14.5 %    Platelets 232 150 - 440 K/uL  Differential     Status: None   Collection Time: 07/02/15  7:45 PM  Result Value Ref Range   Neutrophils Relative % 69 %   Neutro Abs 6.0 1.4 - 6.5 K/uL   Lymphocytes Relative 20 %   Lymphs Abs 1.7 1.0 - 3.6 K/uL   Monocytes Relative 9 %   Monocytes Absolute 0.8 0.2 - 1.0 K/uL   Eosinophils Relative 2 %   Eosinophils Absolute 0.1 0 - 0.7 K/uL   Basophils Relative 0 %   Basophils Absolute 0.0 0 - 0.1 K/uL  Comprehensive metabolic panel     Status: Abnormal   Collection Time: 07/02/15  7:45 PM  Result Value Ref Range   Sodium 137 135 - 145 mmol/L   Potassium 4.1 3.5 - 5.1 mmol/L   Chloride 102 101 - 111 mmol/L   CO2 27 22 - 32 mmol/L   Glucose, Bld 161 (H) 65 - 99 mg/dL   BUN 21 (H) 6 - 20 mg/dL   Creatinine, Ser 0.98 0.61 - 1.24 mg/dL   Calcium 9.1 8.9 -  10.3 mg/dL   Total Protein 7.7 6.5 - 8.1 g/dL   Albumin 3.9 3.5 - 5.0 g/dL   AST 14 (L) 15 - 41 U/L   ALT <5 (L) 17 - 63 U/L   Alkaline Phosphatase 113 38 - 126 U/L   Total Bilirubin 0.5 0.3 - 1.2 mg/dL   GFR calc non Af Amer >60 >60 mL/min   GFR calc Af Amer >60 >60 mL/min    Comment: (NOTE) The eGFR has been calculated using the CKD EPI equation. This calculation has not been validated in all clinical situations. eGFR's persistently <60 mL/min signify possible Chronic Kidney Disease.    Anion gap 8 5 - 15  Troponin I     Status: None   Collection Time: 07/02/15  7:45 PM  Result Value Ref Range   Troponin I <0.03 <0.031 ng/mL    Comment:        NO INDICATION OF MYOCARDIAL INJURY.   TSH     Status: None   Collection Time: 07/03/15  5:53 AM  Result Value Ref Range   TSH 1.573 0.350 - 4.500 uIU/mL  Basic metabolic panel     Status: Abnormal   Collection Time: 07/03/15  5:53 AM  Result Value Ref Range   Sodium 139 135 - 145 mmol/L   Potassium 3.7 3.5 - 5.1 mmol/L   Chloride 107 101 - 111 mmol/L   CO2 26 22 - 32 mmol/L   Glucose, Bld 102 (H) 65 - 99 mg/dL   BUN 16 6 - 20 mg/dL    Creatinine, Ser 0.77 0.61 - 1.24 mg/dL   Calcium 8.7 (L) 8.9 - 10.3 mg/dL   GFR calc non Af Amer >60 >60 mL/min   GFR calc Af Amer >60 >60 mL/min    Comment: (NOTE) The eGFR has been calculated using the CKD EPI equation. This calculation has not been validated in all clinical situations. eGFR's persistently <60 mL/min signify possible Chronic Kidney Disease.    Anion gap 6 5 - 15  CBC     Status: None   Collection Time: 07/03/15  5:53 AM  Result Value Ref Range   WBC 7.3 3.8 - 10.6 K/uL   RBC 4.49 4.40 - 5.90 MIL/uL   Hemoglobin 13.8 13.0 - 18.0 g/dL   HCT 41.3 40.0 - 52.0 %   MCV 92.1 80.0 - 100.0 fL   MCH 30.6 26.0 - 34.0 pg   MCHC 33.3 32.0 - 36.0 g/dL   RDW 14.0 11.5 - 14.5 %   Platelets 224 150 - 440 K/uL  Lipid panel     Status: Abnormal   Collection Time: 07/03/15  5:53 AM  Result Value Ref Range   Cholesterol 203 (H) 0 - 200 mg/dL   Triglycerides 76 <150 mg/dL   HDL 39 (L) >40 mg/dL   Total CHOL/HDL Ratio 5.2 RATIO   VLDL 15 0 - 40 mg/dL   LDL Cholesterol 149 (H) 0 - 99 mg/dL    Comment:        Total Cholesterol/HDL:CHD Risk Coronary Heart Disease Risk Table                     Men   Women  1/2 Average Risk   3.4   3.3  Average Risk       5.0   4.4  2 X Average Risk   9.6   7.1  3 X Average Risk  23.4   11.0  Use the calculated Patient Ratio above and the CHD Risk Table to determine the patient's CHD Risk.        ATP III CLASSIFICATION (LDL):  <100     mg/dL   Optimal  100-129  mg/dL   Near or Above                    Optimal  130-159  mg/dL   Borderline  160-189  mg/dL   High  >190     mg/dL   Very High     Ct Head Wo Contrast  07/02/2015   CLINICAL DATA:  Altered mental status with speech difficulty.  EXAM: CT HEAD WITHOUT CONTRAST  TECHNIQUE: Contiguous axial images were obtained from the base of the skull through the vertex without intravenous contrast.  COMPARISON:  02/03/2015 and 11/11/2014  FINDINGS: Ventricles, cisterns and other CSF  spaces are within normal with minimal age related atrophic change. There is mild chronic ischemic microvascular disease present. There is no mass, mass effect, shift of midline structures or acute hemorrhage. There is no evidence suggest acute infarction. Remaining bones and soft tissues are within normal.  IMPRESSION: No acute intracranial findings.  Mild age related atrophy and chronic ischemic microvascular disease.   Electronically Signed   By: Marin Olp M.D.   On: 07/02/2015 20:24   US Carotid Bilateral  07/03/2015   CLINICAL DATA:  81 year old male with a history of TIA.  Cardiovascular risk factors include prior stroke/ TIA.  EXAM: BILATERAL CAROTID DUPLEX ULTRASOUND  TECHNIQUE: Pearline Cables scale imaging, color Doppler and duplex ultrasound were performed of bilateral carotid and vertebral arteries in the neck.  COMPARISON:  Duplex 11/10/2014  FINDINGS: Criteria: Quantification of carotid stenosis is based on velocity parameters that correlate the residual internal carotid diameter with NASCET-based stenosis levels, using the diameter of the distal internal carotid lumen as the denominator for stenosis measurement.  The following velocity measurements were obtained:  RIGHT  ICA:  Systolic 409 cm/sec, Diastolic 32 cm/sec  CCA:  735 cm/sec  SYSTOLIC ICA/CCA RATIO:  1.2  ECA:  138 cm/sec  LEFT  ICA:  Systolic 329 cm/sec, Diastolic 20 set cm/sec  CCA:  924 cm/sec  SYSTOLIC ICA/CCA RATIO:  1.2  ECA:  126 cm/sec  Right Brachial SBP: Not acquired  Left Brachial SBP: Not acquired  RIGHT CAROTID ARTERY: No significant calcifications of the right common carotid artery. Intermediate waveform maintained. Heterogeneous and partially calcified plaque at the right carotid bifurcation. Highest velocity measured in the mid right ICA. No significant lumen shadowing. Low resistance waveform of the right ICA. No significant tortuosity.  RIGHT VERTEBRAL ARTERY: Antegrade flow with low resistance waveform.  LEFT CAROTID ARTERY: No  significant calcifications of the left common carotid artery. Intermediate waveform maintained. Heterogeneous and partially calcified plaque at the left carotid bifurcation without significant lumen shadowing. Low resistance waveform of the left ICA. No significant tortuosity.  LEFT VERTEBRAL ARTERY:  Antegrade flow with low resistance waveform.  IMPRESSION: Heterogeneous and partially calcified plaque of the bilateral carotid system, as above. Discordant findings by established duplex criteria, with velocity bilaterally suggesting at least 50% stenosis, and the ICA/CCA ratio suggesting less burden of disease. If further evaluation is warranted for a more specific evaluation of degree of stenosis, consider cerebral angiogram, or as a second best test, CTA.  Signed,  Dulcy Fanny. Earleen Newport, DO  [facility]  [facility]   Electronically Signed   By: Corrie Mckusick D.O.  On: 07/03/2015 09:38    Review of Systems  Constitutional: Negative.   HENT: Negative.   Eyes: Negative.   Respiratory: Negative.   Cardiovascular: Negative.   Gastrointestinal: Negative.   Genitourinary: Negative.   Musculoskeletal: Negative.   Skin: Negative.   Neurological: Positive for tremors and focal weakness. Negative for dizziness, tingling, sensory change, speech change, seizures and loss of consciousness.  Psychiatric/Behavioral: Negative.    Blood pressure 121/70, pulse 66, temperature 97.8 F (36.6 C), temperature source Oral, resp. rate 20, height 5' 9"  (1.753 m), weight 87.998 kg (194 lb), SpO2 94 %. Physical Exam  Nursing note and vitals reviewed. Constitutional: He appears well-developed and well-nourished. No distress.  HENT:  Head: Normocephalic and atraumatic.  Right Ear: External ear normal.  Left Ear: External ear normal.  Nose: Nose normal.  Mouth/Throat: Oropharynx is clear and moist.  Eyes: Conjunctivae and EOM are normal. Pupils are equal, round, and reactive  to light. No scleral icterus.  Neck: Normal range of motion. Neck supple. No JVD present.  Cardiovascular: Normal rate, regular rhythm, normal heart sounds and intact distal pulses.   No murmur heard. Respiratory: Effort normal and breath sounds normal. No respiratory distress.  GI: Soft. Bowel sounds are normal. He exhibits no distension.  Musculoskeletal: Normal range of motion. He exhibits no edema.  Neurological:  Alert and oriented x 2 not time, mild dysarthria, nl langauge but does have some word finding difficulty PERRLA, EOMI, nl VF, face symmetric, tongue midline 5/5 B, trace tremor, mild increased tone 1+/4 B, mute plantars FTN and HTS WNL Intact vibration and pinprick  Skin: Skin is warm and dry. He is not diaphoretic.  Psychiatric: He has a normal mood and affect.   CT of head personally reviewed by me and shows mild white matter changes  Assessment/Plan: 1.  Transient ischemic attack-  R leg and Brocas aphasia are in the same vascular territory.  There is concern for possible carotid stenosis vs. cardioembolic source of event.  This is completely different than prior TIA 2.  Parkinsons-  Well controlled -  Pt refuses MRI which is ok -  CTA of head and neck ordered -  Echo pending -  Add ASA 38m daily and start Zocor 253m -  Continue Eliquis as prescribed -  No change to Sinemet -  Will follow but pt can be discharged if CTA and echo are neg -  [facility]  SMWahak HotrontkMAMaine/16/2016, 12:07 PM

## 2015-07-03 NOTE — Care Management (Signed)
Admitted to this facility with the diagnosis of TIA. Lives with daughter, Natalia Leatherwood [phone]). Last seen Dr. Judithann Sheen about a month ago. Home health thru [facility] in the past. No skilled facility. No home oxygen. Uses a rolling walker at night and a cane when he goes out to aid in ambulation. No Life Alert, but has emergency door bells arranged in the home for alerts. Last fall over 6 months ago. O.K appetite. 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 Daughter is looking into [facility] and [facility] for possible assisted living arrangements when needed.  MRI pending. Physical therapy evaluation pending. Gwenette Greet RN MSN Care Management [phone]

## 2015-07-03 NOTE — Progress Notes (Signed)
Called Dr. Judithann Sheen with results of U/S and that Neuro ordered a CT Angiogram of head and neck. Dr. Judithann Sheen ordered to discontinue CT and he will have patient follow up with vascular outpatient.

## 2015-07-03 NOTE — Progress Notes (Signed)
Pt. Admitted with TIA. Head CT neg. MRI not ordered. Paged MD for order for MRI. Per Dr. Betti Cruz wait for rounding MD to to decide if they want MRI this am.

## 2015-07-03 NOTE — Discharge Instructions (Signed)
Needs Home Health °

## 2015-07-03 NOTE — Evaluation (Signed)
Physical Therapy Evaluation Patient Details Name: Jon Mills MRN: [id number] DOB: 1931-05-23 Today's Date: 07/03/2015   History of Present Illness  Pt with TIA, feeling back to his baseline  Clinical Impression  Pt with some baseline Parkinson tremors, decreased balance/ambulation but ultimately does well and is not too far off his baseline.  He does struggle with some static balance acts and could benefit from outpt PT to address this but is safe to return home.      Follow Up Recommendations Outpatient PT    Equipment Recommendations       Recommendations for Other Services       Precautions / Restrictions Precautions Precautions: Fall Restrictions Weight Bearing Restrictions: No      Mobility  Bed Mobility                  Transfers Overall transfer level: Independent               General transfer comment: Pt rises w/o assist, able to maintain balance  Ambulation/Gait Ambulation/Gait assistance: Supervision Ambulation Distance (Feet): 200 Feet Assistive device: None       General Gait Details: Pt with slow, but consistent ambulation.  He (and daughter) report that he is close to his baseline.  He has no LOBs and only minimal fatigue with the effort.    Stairs            Wheelchair Mobility    Modified Rankin (Stroke Patients Only)       Balance Overall balance assessment: Modified Independent;Needs assistance Sitting-balance support:  (needs b/l UE for marching in place, 8 sec NBOS EC)                                         Pertinent Vitals/Pain Pain Assessment: No/denies pain    Home Living Family/patient expects to be discharged to:: Private residence Living Arrangements:  (lives alone, but aide/daughter spend the night) Available Help at Discharge: Family;Personal care attendant         Home Layout: One level        Prior Function Level of Independence: Independent (uses an AD to get up at  night)               Hand Dominance        Extremity/Trunk Assessment   Upper Extremity Assessment: Overall WFL for tasks assessed           Lower Extremity Assessment: Overall WFL for tasks assessed         Communication   Communication: No difficulties  Cognition Arousal/Alertness: Awake/alert Behavior During Therapy: WFL for tasks assessed/performed Overall Cognitive Status: Within Functional Limits for tasks assessed                      General Comments      Exercises        Assessment/Plan    PT Assessment    PT Diagnosis Difficulty walking   PT Problem List    PT Treatment Interventions     PT Goals (Current goals can be found in the Care Plan section) Acute Rehab PT Goals Patient Stated Goal: Pt just wants to get back home PT Goal Formulation: With patient/family Time For Goal Achievement: 07/17/15 Potential to Achieve Goals: Good    Frequency     Barriers to discharge  Co-evaluation               End of Session Equipment Utilized During Treatment: Gait belt Activity Tolerance: Patient tolerated treatment well Patient left: with chair alarm set      Functional Assessment Tool Used:  (clinical judgement) Functional Limitation: Mobility: Walking and moving around Mobility: Walking and Moving Around Current Status (Z6109): At least 20 percent but less than 40 percent impaired, limited or restricted Mobility: Walking and Moving Around Goal Status [phone]): At least 1 percent but less than 20 percent impaired, limited or restricted    Time: 0981-1914 PT Time Calculation (min) (ACUTE ONLY): 18 min   Charges:   PT Evaluation $Initial PT Evaluation Tier I: 1 Procedure     PT G Codes:   PT G-Codes **NOT FOR INPATIENT CLASS** Functional Assessment Tool Used:  (clinical judgement) Functional Limitation: Mobility: Walking and moving around Mobility: Walking and Moving Around Current Status (N8295): At least 20  percent but less than 40 percent impaired, limited or restricted Mobility: Walking and Moving Around Goal Status [phone]): At least 1 percent but less than 20 percent impaired, limited or restricted   Jon Mills, PT, DPT [phone]  Jon Mills 07/03/2015, 12:20 PM

## 2015-07-03 NOTE — Progress Notes (Signed)
Patient own meds returned to [facility]. Patient's daughter is going to bring the meds home.  Nathan A. Choteau, [place].D. Clinical Pharmacist 07/03/2015 0022

## 2015-07-03 NOTE — Discharge Summary (Signed)
Jon Mills, is a 81 y.o. male  DOB [birth year]  MRN [id number].  Admission date:  07/02/2015  Admitting Physician  Ramonita Lab, MD  Discharge Date:  07/03/2015   Primary MD  SPARKS,JEFFREY D, MD  Recommendations for primary care physician for things to follow:      Admission Diagnosis  Transient cerebral ischemia, unspecified transient cerebral ischemia type [G45.9]   Discharge Diagnosis  Transient cerebral ischemia, unspecified transient cerebral ischemia type [G45.9]  Parkinson's  Active Problems:   TIA (transient ischemic attack)      Past Medical History  Diagnosis Date  . Parkinson's disease   . TIA (transient ischemic attack)   . A-fib     History reviewed. No pertinent past surgical history.     History of present illness and  Hospital Course:     Kindly see H&P for history of present illness and admission details, please review complete Labs, Consult reports and Test reports for all details in brief  HPI  from the history and physical done on the day of admission    Hospital Course   Pt admitted with speech issues worrisome for TIA. Head CT negative. On Eliquis. Sx's have resolved, back to baseline. VSS. Wants to go home.   Discharge Condition: stable   Follow UP  Follow-up Information    Follow up with SPARKS,JEFFREY D, MD In 1 week.   Specialty:  Internal Medicine   Contact information:   [address] [place] [address] [phone]         Discharge Instructions  and  Discharge Medications   Home Health     Medication List    TAKE these medications        aspirin 325 MG EC tablet  Take 1 tablet (325 mg total) by mouth daily.     carbidopa-levodopa 50-200 MG per tablet  Commonly known as:  SINEMET CR  Take 2-3 tablets by mouth at bedtime.     carbidopa-levodopa 25-250 MG per tablet  Commonly known as:  SINEMET IR  Take 1 tablet  by mouth 5 (five) times daily. May take an additional 1/2 at bedtime if needed     Carbidopa-Levodopa ER 25-100 MG tablet controlled release  Commonly known as:  SINEMET CR  Take 1 tablet by mouth 5 (five) times daily. This is to be taken with the 25-250 dose     diltiazem 180 MG 24 hr capsule  Commonly known as:  TIAZAC  Take 180 mg by mouth daily.     diphenhydramine-acetaminophen 25-500 MG Tabs  Commonly known as:  TYLENOL PM  Take 1 tablet by mouth at bedtime as needed.     docusate sodium 100 MG capsule  Commonly known as:  COLACE  Take 1 capsule (100 mg total) by mouth 2 (two) times daily.     ELIQUIS 5 MG Tabs tablet  Generic drug:  apixaban  Take 5 mg by mouth 2 (two) times daily.     metoprolol tartrate 25 MG tablet  Commonly known as:  LOPRESSOR  Take 25 mg by mouth 2 (two) times daily.     ondansetron 4 MG tablet  Commonly known as:  ZOFRAN  Take 1 tablet (4 mg total) by mouth every 6 (six) hours as needed for nausea.     QUEtiapine 25 MG tablet  Commonly known as:  SEROQUEL  Take 12.5-25 mg by mouth at bedtime.          Diet and Activity recommendation: See Discharge Instructions above   Consults obtained - none   Major procedures and Radiology Reports - PLEASE review detailed and final reports for all details, in brief -   See below   Ct Head Wo Contrast  07/02/2015   CLINICAL DATA:  Altered mental status with speech difficulty.  EXAM: CT HEAD WITHOUT CONTRAST  TECHNIQUE: Contiguous axial images were obtained from the base of the skull through the vertex without intravenous contrast.  COMPARISON:  02/03/2015 and 11/11/2014  FINDINGS: Ventricles, cisterns and other CSF spaces are within normal with minimal age related atrophic change. There is mild chronic ischemic microvascular disease present. There is no mass, mass effect, shift of midline structures or acute hemorrhage. There is no evidence suggest acute infarction. Remaining bones and soft tissues  are within normal.  IMPRESSION: No acute intracranial findings.  Mild age related atrophy and chronic ischemic microvascular disease.   Electronically Signed   By: Elberta Fortis M.D.   On: 07/02/2015 20:24    Micro Results   none  No results found for this or any previous visit (from the past 240 hour(s)).     Today   Subjective:   Jon Mills today has no headache,no chest abdominal pain,no new weakness tingling or numbness, feels much better wants to go home today.   Objective:   Blood pressure 121/70, pulse 66, temperature 97.8 F (36.6 C), temperature source Oral, resp. rate 20, height  (1.753 m), weight 87.998 kg (194 lb), SpO2 94 %.  No intake or output data in the 24 hours ending 07/03/15 0727  Exam Awake Alert, Oriented x 3, No new F.N deficits, Normal affect [place].AT,PERRAL Supple Neck,No JVD, No cervical lymphadenopathy appriciated.  Symmetrical Chest wall movement, Good air movement bilaterally, CTAB RRR,No Gallops,Rubs or new Murmurs, No Parasternal Heave +ve B.Sounds, Abd Soft, Non tender, No organomegaly appriciated, No rebound -guarding or rigidity. No Cyanosis, Clubbing or edema, No new Rash or bruise  Data Review   CBC w Diff:  Lab Results  Component Value Date   WBC 7.3 07/03/2015   WBC 7.3 02/03/2015   HGB 13.8 07/03/2015   HGB 14.4 02/03/2015   HCT 41.3 07/03/2015   HCT 43.5 02/03/2015   PLT 224 07/03/2015   PLT 186 02/03/2015   LYMPHOPCT 20 07/02/2015   LYMPHOPCT 10.3 02/03/2015   MONOPCT 9 07/02/2015   MONOPCT 7.7 02/03/2015   EOSPCT 2 07/02/2015   EOSPCT 1.1 02/03/2015   BASOPCT 0 07/02/2015   BASOPCT 0.4 02/03/2015    CMP:  Lab Results  Component Value Date   NA 139 07/03/2015   NA 135 02/03/2015   K 3.7 07/03/2015   K 3.9 02/03/2015   CL 107 07/03/2015   CL 104 02/03/2015   CO2 26 07/03/2015   CO2 25 02/03/2015   BUN 16 07/03/2015   BUN 20 02/03/2015   CREATININE 0.77 07/03/2015   CREATININE 0.85 02/03/2015   PROT  7.7 07/02/2015   PROT 7.0 11/11/2014   ALBUMIN 3.9 07/02/2015  ALBUMIN 3.5 11/11/2014   BILITOT 0.5 07/02/2015   BILITOT 0.7 11/11/2014   ALKPHOS 113 07/02/2015   ALKPHOS 83 11/11/2014   AST 14* 07/02/2015   AST 31 11/11/2014   ALT <5* 07/02/2015   ALT 11* 11/11/2014  .   Total Time in preparing paper work, data evaluation and todays exam - 45 minutes  SPARKS,JEFFREY D M.D on 07/03/2015 at 7:27 AM

## 2015-07-03 NOTE — Plan of Care (Signed)
Problem: Discharge/Transitional Outcomes Goal: Other Discharge Outcomes/Goals Outcome: Progressing Alert and oriented. No complaints of pain or discomfort. VSS. 1 assist to BR. NIHSS score of 0. Q 2 neuro checks performed.

## 2015-07-03 NOTE — Progress Notes (Addendum)
Pt discharged home per MD order. IV removed. Discharge instructions, medicines, prescriptions, follow up care, activity and diet instructions given to daughter. All questions answered. Daughter verbalized understanding. Pt left via wheelchair with nursing and family member.

## 2015-08-03 ENCOUNTER — Other Ambulatory Visit: Payer: Self-pay | Admitting: Vascular Surgery

## 2015-08-03 DIAGNOSIS — I6529 Occlusion and stenosis of unspecified carotid artery: Secondary | ICD-10-CM

## 2015-08-11 ENCOUNTER — Ambulatory Visit
Admission: RE | Admit: 2015-08-11 | Discharge: 2015-08-11 | Disposition: A | Discharge status: Home / Self Care | Payer: Medicare Other | Source: Ambulatory Visit | Attending: Vascular Surgery | Admitting: Vascular Surgery

## 2015-08-11 DIAGNOSIS — I6523 Occlusion and stenosis of bilateral carotid arteries: Secondary | ICD-10-CM | POA: Insufficient documentation

## 2015-08-11 DIAGNOSIS — I6529 Occlusion and stenosis of unspecified carotid artery: Secondary | ICD-10-CM

## 2015-08-11 MED ORDER — IOHEXOL 350 MG/ML SOLN
100.0000 mL | Freq: Once | INTRAVENOUS | Status: AC | PRN
  Administered 2015-08-11: 80 mL via INTRAVENOUS

## 2016-05-02 ENCOUNTER — Emergency Department: Payer: Medicare Other

## 2016-05-02 ENCOUNTER — Observation Stay
Admission: EM | Admit: 2016-05-02 | Discharge: 2016-05-03 | Disposition: A | Discharge status: Home / Self Care | Payer: Medicare Other | Attending: Internal Medicine | Admitting: Internal Medicine

## 2016-05-02 ENCOUNTER — Observation Stay: Payer: Medicare Other

## 2016-05-02 ENCOUNTER — Encounter: Payer: Self-pay | Admitting: Emergency Medicine

## 2016-05-02 DIAGNOSIS — G20 Parkinson's disease: Secondary | ICD-10-CM | POA: Diagnosis not present

## 2016-05-02 DIAGNOSIS — I700 Atherosclerosis of aorta: Secondary | ICD-10-CM | POA: Diagnosis not present

## 2016-05-02 DIAGNOSIS — G459 Transient cerebral ischemic attack, unspecified: Secondary | ICD-10-CM | POA: Diagnosis present

## 2016-05-02 DIAGNOSIS — I4891 Unspecified atrial fibrillation: Secondary | ICD-10-CM | POA: Insufficient documentation

## 2016-05-02 DIAGNOSIS — K219 Gastro-esophageal reflux disease without esophagitis: Secondary | ICD-10-CM | POA: Insufficient documentation

## 2016-05-02 DIAGNOSIS — I639 Cerebral infarction, unspecified: Secondary | ICD-10-CM

## 2016-05-02 DIAGNOSIS — R4781 Slurred speech: Principal | ICD-10-CM | POA: Diagnosis present

## 2016-05-02 DIAGNOSIS — J90 Pleural effusion, not elsewhere classified: Secondary | ICD-10-CM | POA: Insufficient documentation

## 2016-05-02 DIAGNOSIS — H5340 Unspecified visual field defects: Secondary | ICD-10-CM | POA: Diagnosis not present

## 2016-05-02 DIAGNOSIS — I10 Essential (primary) hypertension: Secondary | ICD-10-CM | POA: Insufficient documentation

## 2016-05-02 DIAGNOSIS — Z79899 Other long term (current) drug therapy: Secondary | ICD-10-CM | POA: Diagnosis not present

## 2016-05-02 DIAGNOSIS — Z7901 Long term (current) use of anticoagulants: Secondary | ICD-10-CM | POA: Diagnosis not present

## 2016-05-02 DIAGNOSIS — Z87891 Personal history of nicotine dependence: Secondary | ICD-10-CM | POA: Insufficient documentation

## 2016-05-02 DIAGNOSIS — Z8673 Personal history of transient ischemic attack (TIA), and cerebral infarction without residual deficits: Secondary | ICD-10-CM | POA: Insufficient documentation

## 2016-05-02 DIAGNOSIS — Z515 Encounter for palliative care: Secondary | ICD-10-CM

## 2016-05-02 DIAGNOSIS — Z66 Do not resuscitate: Secondary | ICD-10-CM

## 2016-05-02 DIAGNOSIS — G20A1 Parkinson's disease without dyskinesia, without mention of fluctuations: Secondary | ICD-10-CM

## 2016-05-02 DIAGNOSIS — Z7982 Long term (current) use of aspirin: Secondary | ICD-10-CM | POA: Diagnosis not present

## 2016-05-02 HISTORY — DX: Gastro-esophageal reflux disease without esophagitis: K21.9

## 2016-05-02 LAB — URINALYSIS COMPLETE WITH MICROSCOPIC (ARMC ONLY)
BILIRUBIN URINE: NEGATIVE
Bacteria, UA: NONE SEEN
GLUCOSE, UA: NEGATIVE mg/dL
HGB URINE DIPSTICK: NEGATIVE
LEUKOCYTES UA: NEGATIVE
Nitrite: NEGATIVE
Protein, ur: 30 mg/dL — AB
SPECIFIC GRAVITY, URINE: 1.031 — AB (ref 1.005–1.030)
pH: 5 (ref 5.0–8.0)

## 2016-05-02 LAB — CBC WITH DIFFERENTIAL/PLATELET
Basophils Absolute: 0 10*3/uL (ref 0–0.1)
Basophils Relative: 1 %
EOS PCT: 1 %
Eosinophils Absolute: 0.1 10*3/uL (ref 0–0.7)
HCT: 47.1 % (ref 40.0–52.0)
Hemoglobin: 16.1 g/dL (ref 13.0–18.0)
LYMPHS ABS: 1.5 10*3/uL (ref 1.0–3.6)
LYMPHS PCT: 14 %
MCH: 32.7 pg (ref 26.0–34.0)
MCHC: 34.1 g/dL (ref 32.0–36.0)
MCV: 95.9 fL (ref 80.0–100.0)
MONO ABS: 1.2 10*3/uL — AB (ref 0.2–1.0)
MONOS PCT: 11 %
Neutro Abs: 7.9 10*3/uL — ABNORMAL HIGH (ref 1.4–6.5)
Neutrophils Relative %: 73 %
PLATELETS: 203 10*3/uL (ref 150–440)
RBC: 4.91 MIL/uL (ref 4.40–5.90)
RDW: 13.8 % (ref 11.5–14.5)
WBC: 10.7 10*3/uL — ABNORMAL HIGH (ref 3.8–10.6)

## 2016-05-02 LAB — TROPONIN I

## 2016-05-02 LAB — COMPREHENSIVE METABOLIC PANEL
ALK PHOS: 86 U/L (ref 38–126)
ALT: <5 U/L — ABNORMAL LOW (ref 17–63)
ANION GAP: 10 (ref 5–15)
AST: 17 U/L (ref 15–41)
Albumin: 4 g/dL (ref 3.5–5.0)
BILIRUBIN TOTAL: 1.1 mg/dL (ref 0.3–1.2)
BUN: 28 mg/dL — ABNORMAL HIGH (ref 6–20)
CO2: 20 mmol/L — ABNORMAL LOW (ref 22–32)
Calcium: 9 mg/dL (ref 8.9–10.3)
Chloride: 107 mmol/L (ref 101–111)
Creatinine, Ser: 1.03 mg/dL (ref 0.61–1.24)
GFR calc non Af Amer: >60 mL/min (ref >60)
Glucose, Bld: 114 mg/dL — ABNORMAL HIGH (ref 65–99)
Potassium: 4.4 mmol/L (ref 3.5–5.1)
SODIUM: 137 mmol/L (ref 135–145)
Total Protein: 6.9 g/dL (ref 6.5–8.1)

## 2016-05-02 LAB — APTT: aPTT: 35 seconds (ref 24–36)

## 2016-05-02 LAB — PROTIME-INR
INR: 1.22
Prothrombin Time: 15.6 seconds — ABNORMAL HIGH (ref 11.4–15.0)

## 2016-05-02 LAB — GLUCOSE, CAPILLARY: Glucose-Capillary: 124 mg/dL — ABNORMAL HIGH (ref 65–99)

## 2016-05-02 LAB — ETHANOL: Alcohol, Ethyl (B): <5 mg/dL (ref <5)

## 2016-05-02 MED ORDER — CARBIDOPA-LEVODOPA ER 50-200 MG PO TBCR
2.0000 | EXTENDED_RELEASE_TABLET | Freq: Every day | ORAL | Status: DC
  Administered 2016-05-02: 22:00:00 2 via ORAL
  Filled 2016-05-02: qty 2

## 2016-05-02 MED ORDER — BISACODYL 5 MG PO TBEC: 5.0000 mg | DELAYED_RELEASE_TABLET | Freq: Every day | ORAL | Status: DC | PRN

## 2016-05-02 MED ORDER — ONDANSETRON HCL 4 MG PO TABS
4.0000 mg | ORAL_TABLET | Freq: Four times a day (QID) | ORAL | Status: DC | PRN
Start: 2016-05-02 — End: 2016-05-03

## 2016-05-02 MED ORDER — CARBIDOPA-LEVODOPA ER 25-100 MG PO TBCR
1.0000 | EXTENDED_RELEASE_TABLET | Freq: Every day | ORAL | Status: DC
  Administered 2016-05-02: 16:00:00 1 via ORAL
  Filled 2016-05-02 (×2): qty 1

## 2016-05-02 MED ORDER — HYDROCODONE-ACETAMINOPHEN 5-325 MG PO TABS: 1.0000 | ORAL_TABLET | ORAL | Status: DC | PRN

## 2016-05-02 MED ORDER — TRAZODONE HCL 50 MG PO TABS: 25.0000 mg | ORAL_TABLET | Freq: Every evening | ORAL | Status: DC | PRN

## 2016-05-02 MED ORDER — METOPROLOL TARTRATE 25 MG PO TABS: 12.5000 mg | ORAL_TABLET | Freq: Two times a day (BID) | ORAL | Status: DC

## 2016-05-02 MED ORDER — ALTEPLASE 100 MG IV SOLR
INTRAVENOUS | Status: DC
Start: 2016-05-02 — End: 2016-05-02
  Filled 2016-05-02: qty 100

## 2016-05-02 MED ORDER — ASPIRIN 81 MG PO CHEW
324.0000 mg | CHEWABLE_TABLET | Freq: Once | ORAL | Status: AC
  Administered 2016-05-02: 324 mg via ORAL
  Filled 2016-05-02: qty 4

## 2016-05-02 MED ORDER — ACETAMINOPHEN 650 MG RE SUPP: 650.0000 mg | Freq: Four times a day (QID) | RECTAL | Status: DC | PRN

## 2016-05-02 MED ORDER — ONDANSETRON HCL 4 MG/2ML IJ SOLN: 4.0000 mg | Freq: Four times a day (QID) | INTRAMUSCULAR | Status: DC | PRN

## 2016-05-02 MED ORDER — APIXABAN 5 MG PO TABS
5.0000 mg | ORAL_TABLET | Freq: Two times a day (BID) | ORAL | Status: DC
  Administered 2016-05-02 – 2016-05-03 (×2): 5 mg via ORAL
  Filled 2016-05-02 (×2): qty 1

## 2016-05-02 MED ORDER — PANTOPRAZOLE SODIUM 40 MG PO TBEC
40.0000 mg | DELAYED_RELEASE_TABLET | Freq: Every day | ORAL | Status: DC
  Administered 2016-05-03: 40 mg via ORAL
  Filled 2016-05-02: qty 1

## 2016-05-02 MED ORDER — CARBIDOPA-LEVODOPA 25-250 MG PO TABS
1.0000 | ORAL_TABLET | ORAL | Status: DC
  Administered 2016-05-02 – 2016-05-03 (×4): 1 via ORAL
  Filled 2016-05-02 (×6): qty 1

## 2016-05-02 MED ORDER — CARBIDOPA-LEVODOPA 25-250 MG PO TABS
1.0000 | ORAL_TABLET | Freq: Every day | ORAL | Status: DC
  Administered 2016-05-02: 1 via ORAL
  Filled 2016-05-02 (×2): qty 1

## 2016-05-02 MED ORDER — LORAZEPAM 2 MG/ML IJ SOLN
1.0000 mg | Freq: Once | INTRAMUSCULAR | Status: AC
  Administered 2016-05-02: 17:00:00 1 mg via INTRAVENOUS
  Filled 2016-05-02: qty 1

## 2016-05-02 MED ORDER — ACETAMINOPHEN ER 650 MG PO TBCR: 650.0000 mg | EXTENDED_RELEASE_TABLET | Freq: Three times a day (TID) | ORAL | Status: DC | PRN

## 2016-05-02 MED ORDER — DILTIAZEM HCL ER COATED BEADS 180 MG PO CP24
180.0000 mg | ORAL_CAPSULE | Freq: Every day | ORAL | Status: DC
  Administered 2016-05-03: 180 mg via ORAL
  Filled 2016-05-02: qty 1

## 2016-05-02 MED ORDER — SENNA 8.6 MG PO TABS
1.0000 | ORAL_TABLET | Freq: Every day | ORAL | Status: DC
  Administered 2016-05-03: 8.6 mg via ORAL
  Filled 2016-05-02: qty 1

## 2016-05-02 MED ORDER — CARBIDOPA-LEVODOPA ER 25-100 MG PO TBCR
1.0000 | EXTENDED_RELEASE_TABLET | ORAL | Status: DC
  Administered 2016-05-02 – 2016-05-03 (×4): 1 via ORAL
  Filled 2016-05-02 (×6): qty 1

## 2016-05-02 MED ORDER — DOCUSATE SODIUM 100 MG PO CAPS
100.0000 mg | ORAL_CAPSULE | Freq: Two times a day (BID) | ORAL | Status: DC
  Administered 2016-05-02 – 2016-05-03 (×3): 100 mg via ORAL
  Filled 2016-05-02 (×3): qty 1

## 2016-05-02 MED ORDER — FLUTICASONE PROPIONATE 50 MCG/ACT NA SUSP
1.0000 | Freq: Every day | NASAL | Status: DC
  Administered 2016-05-02 – 2016-05-03 (×2): 1 via NASAL
  Filled 2016-05-02: qty 16

## 2016-05-02 MED ORDER — ACETAMINOPHEN 325 MG PO TABS
650.0000 mg | ORAL_TABLET | Freq: Four times a day (QID) | ORAL | Status: DC | PRN
  Administered 2016-05-03: 11:00:00 650 mg via ORAL
  Filled 2016-05-02: qty 2

## 2016-05-02 NOTE — ED Provider Notes (Signed)
[facility] Emergency Department Provider Note   ____________________________________________  Time seen: Approximately 12:25 PM  I have reviewed the triage vital signs and the nursing notes.   HISTORY  Chief Complaint Code Stroke    HPI Jon Mills is a 82 y.o. male with history of atrial fibrillation on elliquis, TIAs, Parkinson's disease who presents for evaluation of patient's disturbance as well as right-sided visual changes since 11:30 AM, constant, improving, initially severe, no modifying factors. No chest pain or difficulty breathing. No recent illness including no vomiting, diarrhea, fevers or chills.   Past Medical History  Diagnosis Date  . Parkinson's disease (HCC)   . TIA (transient ischemic attack)   . A-fib [facility])     Patient Active Problem List   Diagnosis Date Noted  . TIA (transient ischemic attack) 07/02/2015    History reviewed. No pertinent past surgical history.  Current Outpatient Rx  Name  Route  Sig  Dispense  Refill  . apixaban (ELIQUIS) 5 MG TABS tablet   Oral   Take 5 mg by mouth 2 (two) times daily.         . carbidopa-levodopa (SINEMET IR) 25-250 MG tablet   Oral   Take 1 tablet by mouth 3 (three) times daily.         . Carbidopa-Levodopa ER (SINEMET CR) 25-100 MG tablet controlled release   Oral   Take 1 tablet by mouth 5 (five) times daily. This is to be taken with the 25-250 dose         . diltiazem (TIAZAC) 180 MG 24 hr capsule   Oral   Take 180 mg by mouth daily.         . diphenhydramine-acetaminophen (TYLENOL PM) 25-500 MG TABS   Oral   Take 1 tablet by mouth at bedtime as needed.         . metoprolol tartrate (LOPRESSOR) 25 MG tablet   Oral   Take 12.5 mg by mouth 2 (two) times daily.            Allergies Review of patient's allergies indicates no known allergies.  History reviewed. No pertinent family history.  Social History Social History  Substance Use Topics  .  Smoking status: Former Games developer  . Smokeless tobacco: None  . Alcohol Use: None    Review of Systems Constitutional: No fever/chills Eyes: No visual changes. ENT: No sore throat. Cardiovascular: Denies chest pain. Respiratory: Denies shortness of breath. Gastrointestinal: No abdominal pain.  No nausea, no vomiting.  No diarrhea.  No constipation. Genitourinary: Negative for dysuria. Musculoskeletal: Negative for back pain. Skin: Negative for rash. Neurological: Negative for headaches, focal weakness or numbness.  10-point ROS otherwise negative.  ____________________________________________   PHYSICAL EXAM:  Filed Vitals:   05/02/16 1225 05/02/16 1230 05/02/16 1241 05/02/16 1320  BP: 139/67 134/56 139/67   Pulse: 57 61 57   Temp:   98.5 F (36.9 C) 98.5 F (36.9 C)  TempSrc:   Oral   Resp:   18   Height:    (1.753 m)   Weight:   186 lb 4.8 oz (84.505 kg)   SpO2: 97% 97% 97%      Constitutional: Alert and oriented. Nontoxic- appearing and in no acute distress. +Dyskinesia of Parkinson's. Eyes: Conjunctivae are normal. PERRL. EOMI. Head: Atraumatic. Nose: No congestion/rhinnorhea. Mouth/Throat: Mucous membranes are moist.  Oropharynx non-erythematous. Neck: No stridor. Supple without meningisms. Cardiovascular: Normal rate, irregular rhythm. Grossly normal heart sounds.  Good  peripheral circulation. Respiratory: Normal respiratory effort.  No retractions. Lungs CTAB. Gastrointestinal: Soft and nontender. No distention. No CVA tenderness. Genitourinary: deferred Musculoskeletal: No lower extremity tenderness nor edema.  No joint effusions. Neurologic:  Speech with mild expressive aphasia, no dysarthria. 5 out of 5 strength in bilateral upper and lower extremities, sensation intact to light touch throughout. Intention tremor of Parkinson's is noted. Skin:  Skin is warm, dry and intact. No rash noted. Psychiatric: Mood and affect are normal. Speech and behavior are  normal.  ____________________________________________   LABS (all labs ordered are listed, but only abnormal results are displayed)  Labs Reviewed  COMPREHENSIVE METABOLIC PANEL - Abnormal; Notable for the following:    CO2 20 (*)    Glucose, Bld 114 (*)    BUN 28 (*)    ALT <5 (*)    All other components within normal limits  URINALYSIS COMPLETEWITH MICROSCOPIC ([facility] ONLY) - Abnormal; Notable for the following:    Color, Urine AMBER (*)    APPearance CLEAR (*)    Ketones, ur 1+ (*)    Specific Gravity, Urine 1.031 (*)    Protein, ur 30 (*)    Squamous Epithelial / LPF 0-5 (*)    All other components within normal limits  GLUCOSE, CAPILLARY - Abnormal; Notable for the following:    Glucose-Capillary 124 (*)    All other components within normal limits  CBC WITH DIFFERENTIAL/PLATELET - Abnormal; Notable for the following:    WBC 10.7 (*)    Neutro Abs 7.9 (*)    Monocytes Absolute 1.2 (*)    All other components within normal limits  ETHANOL  TROPONIN I  URINE RAPID DRUG SCREEN, HOSP PERFORMED  PROTIME-INR  APTT  I-STAT CHEM 8, ED   ____________________________________________  EKG  ED ECG REPORT I, Gayla DossGayle, [place] A, the attending physician, personally viewed and interpreted this ECG.   Date: 05/02/2016  EKG Time: 13:39  Rate: 74  Rhythm: atrial fibrillation, rate 74  Axis: normal  Intervals:none  ST&T Change: No acute ST elevation or acute ST depression.  ____________________________________________  RADIOLOGY  CT head  IMPRESSION: No acute intracranial abnormality. No intracranial mass, hemorrhage or edema.  Stable mild age-related atrophy and chronic small vessel ischemic changes in the white matter.  These results were called by telephone at the time of interpretation on 05/02/2016 at 12:26 pm to Dr. Jene EveryOBERT KINNER , who verbally acknowledged these results.  CXR IMPRESSION: 1. Diffuse peribronchial cuffing, concerning for an  acute bronchitis. 2. Atelectasis and/or airspace consolidation in the left lower lobe with small left pleural effusion. 3. Aortic atherosclerosis.  ____________________________________________   PROCEDURES  Procedure(s) performed: None  Procedures  Critical Care performed: No  ____________________________________________   INITIAL IMPRESSION / ASSESSMENT AND PLAN / ED COURSE  Pertinent labs & imaging results that were available during my care of the patient were reviewed by me and considered in my medical decision making (see chart for details).  Jon Mills is a 82 y.o. male with history of atrial fibrillation on elliquis, TIAs, Parkinson's disease who presents for evaluation of patient's disturbance as well as right-sided visual changes since 11:30 AM. On arrival to the emergency department, code stroke initiated however given the rapid improvement of symptoms and the fact that the patient is on elliquis, he is not a candidate for TPA. NIHSS 1. I reviewed his labs, CBC with mild leukocytosis, CMP generally unremarkable. Urinalysis is not consistent with infection. Troponin negative. CT head with no acute intracranial  process. Dr. Thad Ranger of neurology recommends admission for TIA workup, I discussed the case with hospice for admission at 2 PM. Aspirin ordered. ____________________________________________   FINAL CLINICAL IMPRESSION(S) / ED DIAGNOSES  Final diagnoses:  Transient cerebral ischemia, unspecified transient cerebral ischemia type      NEW MEDICATIONS STARTED DURING THIS VISIT:  New Prescriptions   No medications on file     Note:  This document was prepared using Dragon voice recognition software and may include unintentional dictation errors.    Gayla Doss, MD 05/02/16 [phone]

## 2016-05-02 NOTE — Plan of Care (Signed)
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

## 2016-05-02 NOTE — ED Notes (Signed)
Pt presents to ED c/o trouble speaking and visual changes. Symptoms started at 1130

## 2016-05-02 NOTE — H&P (Signed)
[facility] [place] [facility]   PATIENT NAME: Jon HiltsStephen Maese    MR#:  [id number]  DATE OF BIRTH:  04-Feb-1931  DATE OF ADMISSION:  05/02/2016  PRIMARY CARE PHYSICIAN: Marguarite ArbourSPARKS,JEFFREY D, MD   REQUESTING/REFERRING PHYSICIAN;Dr,Gayle  CHIEF COMPLAINT:   Chief Complaint  Patient presents with  . Code Stroke    HISTORY OF PRESENT ILLNESS:  Jon Mills  is a 82 y.o. male with a known history of Essential hypertension brought in by the daughter because of slurred speech. She noted  Because of slured  speech around 11 AM this morning without any other focal deficits. By the time patient came to the emergency room patient's  speech, clear. Seen by the neurologist did not recommend TPA because symptoms improved. He is alert awake oriented. Has tremors around the perioral area secondary to Parkinson disease.  PAST MEDICAL HISTORY:   Past Medical History  Diagnosis Date  . Parkinson's disease (HCC)   . TIA (transient ischemic attack)   . A-fib (HCC)     PAST SURGICAL HISTOIRY:  History reviewed. No pertinent past surgical history.  SOCIAL HISTORY:   Social History  Substance Use Topics  . Smoking status: Former Games developermoker  . Smokeless tobacco: Not on file  . Alcohol Use: Not on file    FAMILY HISTORY:  History reviewed. No pertinent family history.  DRUG ALLERGIES:  No Known Allergies  REVIEW OF SYSTEMS:  CONSTITUTIONAL: No fever, fatigue or weakness.  EYES: No blurred or double vision.  EARS, NOSE, AND THROAT: No tinnitus or ear pain.  RESPIRATORY: No cough, shortness of breath, wheezing or hemoptysis.  CARDIOVASCULAR: No chest pain, orthopnea, edema.  GASTROINTESTINAL: No nausea, vomiting, diarrhea or abdominal pain.  GENITOURINARY: No dysuria, hematuria.  ENDOCRINE: No polyuria, nocturia,  HEMATOLOGY: No anemia, easy bruising or bleeding SKIN: No rash or lesion. MUSCULOSKELETAL: No joint pain or arthritis.   NEUROLOGIC: No tingling,  numbness, weakness.Slurred speech this morning but resolved now.  PSYCHIATRY: No anxiety or depression.   MEDICATIONS AT HOME:   Prior to Admission medications   Medication Sig Start Date End Date Taking? Authorizing Provider  acetaminophen (TYLENOL ARTHRITIS PAIN) 650 MG CR tablet Take 650 mg by mouth every 8 (eight) hours as needed for pain.   Yes Historical Provider, MD  apixaban (ELIQUIS) 5 MG TABS tablet Take 5 mg by mouth 2 (two) times daily.   Yes Historical Provider, MD  carbidopa-levodopa (SINEMET IR) 25-250 MG tablet Take 1 tablet by mouth 5 (five) times daily.    Yes Historical Provider, MD  Carbidopa-Levodopa ER (SINEMET CR) 25-100 MG tablet controlled release Take 1 tablet by mouth 5 (five) times daily. This is to be taken with the 25-250 dose   Yes Historical Provider, MD  diltiazem (TIAZAC) 180 MG 24 hr capsule Take 180 mg by mouth daily.   Yes Historical Provider, MD  diphenhydramine-acetaminophen (TYLENOL PM) 25-500 MG TABS Take 1 tablet by mouth at bedtime as needed.   Yes Historical Provider, MD  fluticasone (FLONASE) 50 MCG/ACT nasal spray Place 1 spray into both nostrils daily.   Yes Historical Provider, MD  metoprolol tartrate (LOPRESSOR) 25 MG tablet Take 12.5 mg by mouth 2 (two) times daily.    Yes Historical Provider, MD  omeprazole (PRILOSEC) 20 MG capsule Take 20 mg by mouth daily.   Yes Historical Provider, MD  senna (SENOKOT) 8.6 MG tablet Take 1 tablet by mouth daily.   Yes Historical Provider, MD  VITAL SIGNS:  Blood pressure 139/67, pulse 57, temperature 98.5 F (36.9 C), temperature source Oral, resp. rate 18, height 5\' 9"  (1.753 m), weight 84.505 kg (186 lb 4.8 oz), SpO2 97 %.  PHYSICAL EXAMINATION:  GENERAL:  82 y.o.-year-old patient lying in the bed with no acute distress.  EYES: Pupils equal, round, reactive to light and accommodation. No scleral icterus. Extraocular muscles intact.  HEENT: Head atraumatic, normocephalic. Oropharynx and nasopharynx  clear.  NECK:  Supple, no jugular venous distention. No thyroid enlargement, no tenderness.  LUNGS: Normal breath sounds bilaterally, no wheezing, rales,rhonchi or crepitation. No use of accessory muscles of respiration.  CARDIOVASCULAR: S1, S2 irrregular.No murmurs, rubs, or gallops.  ABDOMEN: Soft, nontender, nondistended. Bowel sounds present. No organomegaly or mass.  EXTREMITIES: No pedal edema, cyanosis, or clubbing.  NEUROLOGIC: Cranial nerves II through XII are intact. Muscle strength 5/5 in all extremities. Sensation intact. Gait not checked.  PSYCHIATRIC: The patient is alert and oriented x 3. Continuous tremors noted around mouth ,due to parkinson.uses cane for ambulation, SKIN: No obvious rash, lesion, or ulcer.   LABORATORY PANEL:   CBC  Recent Labs Lab 05/02/16 1313  WBC 10.7*  HGB 16.1  HCT 47.1  PLT 203   ------------------------------------------------------------------------------------------------------------------  Chemistries   Recent Labs Lab 05/02/16 1228  NA 137  K 4.4  CL 107  CO2 20*  GLUCOSE 114*  BUN 28*  CREATININE 1.03  CALCIUM 9.0  AST 17  ALT <5*  ALKPHOS 86  BILITOT 1.1   ------------------------------------------------------------------------------------------------------------------  Cardiac Enzymes  Recent Labs Lab 05/02/16 1228  TROPONINI <0.03   ------------------------------------------------------------------------------------------------------------------  RADIOLOGY:  Ct Head Wo Contrast  05/02/2016  CLINICAL DATA:  Stroke-like symptoms starting at 11:30 a.m. today. History of Parkinson's disease. EXAM: CT HEAD WITHOUT CONTRAST TECHNIQUE: Contiguous axial images were obtained from the base of the skull through the vertex without intravenous contrast. COMPARISON:  Head CT dated 02/03/2015. FINDINGS: Brain: Ventricles are stable in size and configuration. Mild generalized age related atrophy is stable with commensurate  dilatation of the sulci. Mild chronic small vessel ischemic change again noted within the bilateral periventricular white matter. There is no mass, hemorrhage, edema or other evidence of acute parenchymal abnormality. No extra-axial hemorrhage. Vascular: No hyperdense vessel or unexpected calcification. There are chronic calcified atherosclerotic changes of the large vessels at the skull base. Skull: Negative for fracture or focal lesion. Sinuses/Orbits: No acute findings. Other: None. IMPRESSION: No acute intracranial abnormality. No intracranial mass, hemorrhage or edema. Stable mild age-related atrophy and chronic small vessel ischemic changes in the white matter. These results were called by telephone at the time of interpretation on 05/02/2016 at 12:26 pm to Dr. Jene Every , who verbally acknowledged these results. Electronically Signed   By: Bary Richard M.D.   On: 05/02/2016 12:29   Dg Chest Portable 1 View  05/02/2016  CLINICAL DATA:  82 year old male with history of difficulty speaking and visual changes since 11:30 this morning. EXAM: PORTABLE CHEST 1 VIEW COMPARISON:  Chest x-ray 02/03/2015. FINDINGS: Lung volumes are low. Diffuse peribronchial cuffing. Ill-defined opacities in the left lung base may reflect areas of consolidation and/or atelectasis. Right lung appears clear. Small left pleural effusion. Crowding of the pulmonary vasculature, likely accentuated by low lung volumes, without frank pulmonary edema. Heart size is normal. The patient is rotated to the left on today's exam, resulting in distortion of the mediastinal contours and reduced diagnostic sensitivity and specificity for mediastinal pathology. Atherosclerosis in the thoracic aorta. IMPRESSION: 1.  Diffuse peribronchial cuffing, concerning for an acute bronchitis. 2. Atelectasis and/or airspace consolidation in the left lower lobe with small left pleural effusion. 3. Aortic atherosclerosis. Electronically Signed   By: Trudie Reed M.D.   On: 05/02/2016 13:27    EKG:   Orders placed or performed during the hospital encounter of 05/02/16  . ED EKG  . ED EKG   EKG shows atrial fibrillation 74 bpm, no ST T changes IMPRESSION AND PLAN:   #1 slurred speech resolved likely due to TIA; admit to observation status, patient already is on Eliquis. continue that, check MRI of the brain, echo, carotid ultrasound. According to patient's daughter she mentioned that patient was in statins but  It was stopped because pt  Did not want to take too many meds.now eating a regular diet also. Consult neurology because patient developed symptoms on Eliquis 2. Bradycardia: I just the Cardizem, beta blocker doses. #3 severe Parkinson disease patient is on par carbidopa and Lomotil. He DNR 4.possible left lower lobe pneumonia;watch closely, no symptoms of cough or elevated white count. No indication for antibiotics.  All the records are reviewed and case discussed with ED provider. Management plans discussed with the patient, family and they are in agreement.  CODE STATUS:DNR  TOTAL TIME TAKING CARE OF THIS PATIENT: .    Katha Hamming M.D on 05/02/2016 at 2:22 PM  Between 7am to 6pm - Pager - [phone]  After 6pm go to [email or web address] - password [email or web address] [facility]  [place]  Office  [phone]  CC: Primary care physician; Marguarite Arbour, MD  Note: This dictation was prepared with Dragon dictation along with smaller phrase technology. Any transcriptional errors that result from this process are unintentional.

## 2016-05-02 NOTE — Consult Note (Signed)
Referring Physician: Inocencio HomesGayle    Chief Complaint: Difficulty with speech  HPI: Jon GreavesStephen F Mills is an 82 y.o. male who was at lunch with his daughter.  Acutely patient became unable to talk.  What was said was unintelligible.  No focal weakness or numbness noted.  Patient with a history of TIA in the past.  History of atrial fibrillation on Eliquis.   Initial NIHSS of 4.    Date last known well: 05/02/2016 Time last known well: Time: 11:30 tPA Given: No: Patient on Eliquis, improving symptoms  Past Medical History  Diagnosis Date  . Parkinson's disease (HCC)   . TIA (transient ischemic attack)   . A-fib (HCC)     No past surgical history on file.  Family history: Parents deceased.  Father had tremor.  Has a sister with MG and lupus.  Social History:  reports that he has quit smoking. He does not have any smokeless tobacco history on file. His alcohol and drug histories are not on file.  Allergies: No Known Allergies  Medications: I have reviewed the patient's current medications. Prior to Admission:  Prior to Admission medications   Medication Sig Start Date End Date Taking? Authorizing Provider  apixaban (ELIQUIS) 5 MG TABS tablet Take 5 mg by mouth 2 (two) times daily.   Yes Historical Provider, MD  aspirin EC 325 MG EC tablet Take 1 tablet (325 mg total) by mouth daily. 07/03/15  Yes Marguarite ArbourJeffrey D Sparks, MD  carbidopa-levodopa (SINEMET CR) 50-200 MG per tablet Take 2-3 tablets by mouth at bedtime.   Yes Historical Provider, MD  Carbidopa-Levodopa ER (SINEMET CR) 25-100 MG tablet controlled release Take 1 tablet by mouth 5 (five) times daily. This is to be taken with the 25-250 dose   Yes Historical Provider, MD  diltiazem (TIAZAC) 180 MG 24 hr capsule Take 180 mg by mouth daily.   Yes Historical Provider, MD  diphenhydramine-acetaminophen (TYLENOL PM) 25-500 MG TABS Take 1 tablet by mouth at bedtime as needed.   Yes Historical Provider, MD  docusate sodium (COLACE) 100 MG capsule  Take 1 capsule (100 mg total) by mouth 2 (two) times daily. 07/03/15  Yes Marguarite ArbourJeffrey D Sparks, MD  metoprolol tartrate (LOPRESSOR) 25 MG tablet Take 25 mg by mouth 2 (two) times daily.   Yes Historical Provider, MD           ROS: History obtained from daughter  General ROS: negative for - chills, fatigue, fever, night sweats, weight gain or weight loss Psychological ROS: negative for - behavioral disorder, hallucinations, memory difficulties, mood swings or suicidal ideation Ophthalmic ROS: negative for - blurry vision, double vision, eye pain or loss of vision ENT ROS: negative for - epistaxis, nasal discharge, oral lesions, sore throat, tinnitus or vertigo Allergy and Immunology ROS: negative for - hives or itchy/watery eyes Hematological and Lymphatic ROS: negative for - bleeding problems, bruising or swollen lymph nodes Endocrine ROS: negative for - galactorrhea, hair pattern changes, polydipsia/polyuria or temperature intolerance Respiratory ROS: negative for - cough, hemoptysis, shortness of breath or wheezing Cardiovascular ROS: negative for - chest pain, dyspnea on exertion, edema or irregular heartbeat Gastrointestinal ROS: negative for - abdominal pain, diarrhea, hematemesis, nausea/vomiting or stool incontinence Genito-Urinary ROS: negative for - dysuria, hematuria, incontinence or urinary frequency/urgency Musculoskeletal ROS: negative for - joint swelling or muscular weakness Neurological ROS: as noted in HPI, dyskinesias Dermatological ROS: negative for rash and skin lesion changes  Physical Examination: There were no vitals taken for this visit.  HEENT-  Normocephalic, no lesions, without obvious abnormality.  Normal external eye and conjunctiva.  Normal TM's bilaterally.  Normal auditory canals and external ears. Normal external nose, mucus membranes and septum.  Normal pharynx. Cardiovascular- S1, S2 normal, pulses palpable throughout   Lungs- chest clear, no wheezing, rales,  normal symmetric air entry Abdomen- soft, non-tender; bowel sounds normal; no masses,  no organomegaly Extremities- no edema Lymph-no adenopathy palpable Musculoskeletal-no joint tenderness, deformity or swelling Skin-warm and dry, no hyperpigmentation, vitiligo, or suspicious lesions  Neurological Examination Mental Status: Alert, oriented to name, place and year.  Reports that it is August.  Thought content appropriate.  Speech fluent for the most part but with some noted aphasia with reading.  Dysarthric.  Able to follow 3 step commands but requires reinforcement. Cranial Nerves: II: Discs flat bilaterally; Decreased right visual field, pupils equal, round, reactive to light and accommodation III,IV, VI: ptosis not present, extra-ocular motions intact bilaterally V,VII: smile symmetric, facial light touch sensation normal bilaterally VIII: hearing normal bilaterally IX,X: gag reflex present XI: bilateral shoulder shrug XII: midline tongue extension Motor: Right : Upper extremity   5/5    Left:     Upper extremity   5/5  Lower extremity   5/5     Lower extremity   5/5 Dyskinesias noted throughout including the tongue Sensory: Pinprick and light touch intact throughout, bilaterally Deep Tendon Reflexes: 2+ and symmetric with absent AJ's bilaterally Plantars: Right: upgoing   Left: upgoing Cerebellar: Normal finger-to-nose and normal heel-to-shin testing bilaterally Gait: not tested due to safety concerns   Laboratory Studies:  Basic Metabolic Panel: No results for input(s): NA, K, CL, CO2, GLUCOSE, BUN, CREATININE, CALCIUM, MG, PHOS in the last 168 hours.  Liver Function Tests: No results for input(s): AST, ALT, ALKPHOS, BILITOT, PROT, ALBUMIN in the last 168 hours. No results for input(s): LIPASE, AMYLASE in the last 168 hours. No results for input(s): AMMONIA in the last 168 hours.  CBC: No results for input(s): WBC, NEUTROABS, HGB, HCT, MCV, PLT in the last 168  hours.  Cardiac Enzymes: No results for input(s): CKTOTAL, CKMB, CKMBINDEX, TROPONINI in the last 168 hours.  BNP: Invalid input(s): POCBNP  CBG:  Recent Labs Lab 05/02/16 1228  GLUCAP 124*    Microbiology: No results found for this or any previous visit.  Coagulation Studies: No results for input(s): LABPROT, INR in the last 72 hours.  Urinalysis: No results for input(s): COLORURINE, LABSPEC, PHURINE, GLUCOSEU, HGBUR, BILIRUBINUR, KETONESUR, PROTEINUR, UROBILINOGEN, NITRITE, LEUKOCYTESUR in the last 168 hours.  Invalid input(s): APPERANCEUR  Lipid Panel:    Component Value Date/Time   CHOL 203* 07/03/2015 0553   TRIG 76 07/03/2015 0553   HDL 39* 07/03/2015 0553   CHOLHDL 5.2 07/03/2015 0553   VLDL 15 07/03/2015 0553   LDLCALC 149* 07/03/2015 0553    HgbA1C: No results found for: HGBA1C  Urine Drug Screen:  No results found for: LABOPIA, COCAINSCRNUR, LABBENZ, AMPHETMU, THCU, LABBARB  Alcohol Level: No results for input(s): ETH in the last 168 hours.   Imaging: Ct Head Wo Contrast  05/02/2016  CLINICAL DATA:  Stroke-like symptoms starting at 11:30 a.m. today. History of Parkinson's disease. EXAM: CT HEAD WITHOUT CONTRAST TECHNIQUE: Contiguous axial images were obtained from the base of the skull through the vertex without intravenous contrast. COMPARISON:  Head CT dated 02/03/2015. FINDINGS: Brain: Ventricles are stable in size and configuration. Mild generalized age related atrophy is stable with commensurate dilatation of the sulci. Mild chronic small vessel ischemic change  again noted within the bilateral periventricular white matter. There is no mass, hemorrhage, edema or other evidence of acute parenchymal abnormality. No extra-axial hemorrhage. Vascular: No hyperdense vessel or unexpected calcification. There are chronic calcified atherosclerotic changes of the large vessels at the skull base. Skull: Negative for fracture or focal lesion. Sinuses/Orbits: No acute  findings. Other: None. IMPRESSION: No acute intracranial abnormality. No intracranial mass, hemorrhage or edema. Stable mild age-related atrophy and chronic small vessel ischemic changes in the white matter. These results were called by telephone at the time of interpretation on 05/02/2016 at 12:26 pm to Dr. Jene Every , who verbally acknowledged these results. Electronically Signed   By: Bary Richard M.D.   On: 05/02/2016 12:29    Assessment: 82 y.o. male presenting with complaints of difficulty with speech that is improving.  On neurological examination patient also noted to have right visual field loss that he had not previously noted.  Head CT personally reviewed and shows no acute changes.  Patient compliant with Eliquis therefore not a tPA candidate.  Due to persistent visual changes further work up recommended.    Stroke Risk Factors - atrial fibrillation  Plan: 1. HgbA1c, fasting lipid panel 2. MRI, MRA  of the brain without contrast 3. PT consult, OT consult, Speech consult 4. Echocardiogram 5. Carotid dopplers 6. Prophylactic therapy-Continue Eliquis 7. NPO until RN stroke swallow screen 8. Telemetry monitoring 9. Frequent neuro checks 10. Although patient with dyskinesias would maintain on current Sinemet regimen, including times of administration.      Thana Farr, MD Neurology [phone] 05/02/2016, 12:41 PM

## 2016-05-02 NOTE — ED Notes (Signed)
Code  Stroke  Called  To [phone] 

## 2016-05-03 ENCOUNTER — Observation Stay: Admit: 2016-05-03 | Payer: Medicare Other

## 2016-05-03 DIAGNOSIS — R4781 Slurred speech: Secondary | ICD-10-CM | POA: Diagnosis not present

## 2016-05-03 DIAGNOSIS — G459 Transient cerebral ischemic attack, unspecified: Secondary | ICD-10-CM

## 2016-05-03 DIAGNOSIS — Z66 Do not resuscitate: Secondary | ICD-10-CM

## 2016-05-03 DIAGNOSIS — G20 Parkinson's disease: Secondary | ICD-10-CM | POA: Diagnosis not present

## 2016-05-03 DIAGNOSIS — Z515 Encounter for palliative care: Secondary | ICD-10-CM | POA: Diagnosis not present

## 2016-05-03 LAB — BASIC METABOLIC PANEL
Anion gap: 6 (ref 5–15)
BUN: 25 mg/dL — AB (ref 6–20)
CALCIUM: 8.8 mg/dL — AB (ref 8.9–10.3)
CO2: 25 mmol/L (ref 22–32)
Chloride: 108 mmol/L (ref 101–111)
Creatinine, Ser: 0.77 mg/dL (ref 0.61–1.24)
GFR calc Af Amer: >60 mL/min (ref >60)
GLUCOSE: 97 mg/dL (ref 65–99)
POTASSIUM: 3.8 mmol/L (ref 3.5–5.1)
Sodium: 139 mmol/L (ref 135–145)

## 2016-05-03 LAB — URINE DRUG SCREEN, QUALITATIVE (ARMC ONLY)
Amphetamines, Ur Screen: NOT DETECTED
BENZODIAZEPINE, UR SCRN: NOT DETECTED
Barbiturates, Ur Screen: NOT DETECTED
CANNABINOID 50 NG, UR ~~LOC~~: NOT DETECTED
Cocaine Metabolite,Ur ~~LOC~~: NOT DETECTED
MDMA (Ecstasy)Ur Screen: NOT DETECTED
Methadone Scn, Ur: NOT DETECTED
OPIATE, UR SCREEN: NOT DETECTED
PHENCYCLIDINE (PCP) UR S: NOT DETECTED
Tricyclic, Ur Screen: NOT DETECTED

## 2016-05-03 LAB — HEMOGLOBIN A1C: HEMOGLOBIN A1C: 6.4 % — AB (ref 4.0–6.0)

## 2016-05-03 LAB — LIPID PANEL
CHOL/HDL RATIO: 4.7 ratio
Cholesterol: 218 mg/dL — ABNORMAL HIGH (ref 0–200)
HDL: 46 mg/dL (ref >40)
LDL CALC: 154 mg/dL — AB (ref 0–99)
Triglycerides: 89 mg/dL (ref <150)
VLDL: 18 mg/dL (ref 0–40)

## 2016-05-03 LAB — CBC
HCT: 46 % (ref 40.0–52.0)
Hemoglobin: 16.1 g/dL (ref 13.0–18.0)
MCH: 33 pg (ref 26.0–34.0)
MCHC: 35.1 g/dL (ref 32.0–36.0)
MCV: 94.2 fL (ref 80.0–100.0)
Platelets: 180 10*3/uL (ref 150–440)
RBC: 4.88 MIL/uL (ref 4.40–5.90)
RDW: 13.7 % (ref 11.5–14.5)
WBC: 9.1 10*3/uL (ref 3.8–10.6)

## 2016-05-03 LAB — GLUCOSE, CAPILLARY: GLUCOSE-CAPILLARY: 105 mg/dL — AB (ref 65–99)

## 2016-05-03 MED ORDER — MORPHINE SULFATE (CONCENTRATE) 10 MG/0.5ML PO SOLN: 5.0000 mg | ORAL | Status: DC | PRN

## 2016-05-03 MED ORDER — LORAZEPAM 1 MG PO TABS: 1.0000 mg | ORAL_TABLET | Freq: Three times a day (TID) | ORAL | Status: AC | PRN

## 2016-05-03 MED ORDER — MORPHINE SULFATE (CONCENTRATE) 10 MG/0.5ML PO SOLN: 5.0000 mg | ORAL | Status: AC | PRN

## 2016-05-03 MED ORDER — LORAZEPAM 2 MG/ML IJ SOLN: 1.0000 mg | INTRAMUSCULAR | Status: DC | PRN

## 2016-05-03 NOTE — Progress Notes (Signed)
Had a long d/w daughter Samara DeistKathryn at bedside. She is in agreement with comfort care and so is patient. They are also agreeable with Hospice services at home. They would like sinemet to be continued which is ok from my stand-point. Will stop rest of the meds.  D/w Corrie DandyMary (Palliative care) and CM. If the arrangements for Hospice done today, possible D/C later today. (Home with Hospice)

## 2016-05-03 NOTE — Discharge Instructions (Signed)
Hospice °Hospice is a service that is designed to provide people who are terminally ill and their families with medical, spiritual, and psychological support. Its aim is to improve your quality of life by keeping you as alert and comfortable as possible. Hospice is performed by a team of health care professionals and volunteers who: °· Help keep you comfortable. Hospice can be provided in your home or in a homelike setting. The hospice staff works with your family and friends to help meet your needs. You will enjoy the support of loved ones by receiving much of your basic care from family and friends. °· Provide pain relief and manage your symptoms. The staff supply all necessary medicines and equipment. °· Provide companionship when you are alone. °· Allow you and your family to rest. They may do light housekeeping, prepare meals, and run errands. °· Provide counseling. They will make sure your emotional, spiritual, and social needs and those of your family are being met. °· Provide spiritual care. Spiritual care is individualized to meet your needs and your family's needs. It may involve helping you look at what death means to you, say goodbye, or perform a specific religious ceremony or ritual. °Hospice teams often include: °· A nurse. °· A doctor. °· Social workers. °· Religious leaders (such as a chaplain). °· Trained volunteers. °WHEN SHOULD HOSPICE CARE BEGIN? °Most people who use hospice are believed to have fewer than 6 months to live. Your family and health care providers can help you decide when hospice services should begin. If your condition improves, you may discontinue the program. °WHAT SHOULD I CONSIDER BEFORE SELECTING A PROGRAM? °Most hospice programs are run by nonprofit, independent organizations. Some are affiliated with hospitals, nursing homes, or home health care agencies. Hospice programs can take place in the home or at a hospice center, hospital, or skilled nursing facility. When choosing  a hospice program, ask the following questions: °· What services are available to me? °· What services are offered to my loved ones? °· How involved are my loved ones? °· How involved is my health care provider? °· Who makes up the hospice care team? How are they trained or screened? °· How will my pain and symptoms be managed? °· If my circumstances change, can the services be provided in a different setting, such as my home or in the hospital? °· Is the program reviewed and licensed by the state or certified in some other way? °WHERE CAN I LEARN MORE ABOUT HOSPICE? °You can learn about existing hospice programs in your area from your health care providers. You can also read more about hospice online. The websites of the following organizations contain helpful information: °· The National Hospice and Palliative Care Organization (NHPCO). °· The Hospice Association of America (HAA). °· The Hospice Education Institute. °· The American Cancer Society (ACS). °· Hospice Net. °  °This information is not intended to replace advice given to you by your health care provider. Make sure you discuss any questions you have with your health care provider. °  °Document Released: 01/20/2004 Document Revised: 10/08/2013 Document Reviewed: 08/13/2013 °Elsevier Interactive Patient Education ©2016 [employer] ° °

## 2016-05-03 NOTE — Consult Note (Signed)
Consultation Note Date: 05/03/2016   Patient Name: Jon Mills  DOB: 18-May-1931  MRN: [id number]  Age / Sex: 82 y.o., male  PCP: Marguarite Arbour, MD Referring Physician: Delfino Lovett, MD  Reason for Consultation: Establishing goals of care and Psychosocial/spiritual support  HPI/Patient Profile: 82 y.o. male   admitted on 05/02/2016 essential hypertension brought in by the daughter because of slurred speech. She noted Because of slured speech around 11 AM this morning without any other focal deficits. By the time patient came to the emergency room patient's speech, clear. Seen by the neurologist did not recommend TPA because symptoms improved. He is alert awake oriented.   H/O worsening Parkinson disease.  Per family decreasing po intake, 10 lb weight loss over past three months, increased needs with ADL, more unsteady gait  Patient and family facing advanced directive decisions and anticipatory care needs    Clinical Assessment and Goals of Care:   This NP Lorinda Creed reviewed medical records, received report from team, assessed the patient and then meet at the patient's bedside along with his daughter/ [name]/ HPOA  to discuss diagnosis prognosis, GOC, EOL wishes disposition and options.   A detailed discussion was had today regarding advanced directives.  Concepts specific to code status, artifical feeding and hydration, continued IV antibiotics and rehospitalization was had.  The difference between a aggressive medical intervention path  and a palliative comfort care path for this patient at this time was had.  Values and goals of care important to patient and family were attempted to be elicited.  Concept of Hospice and Palliative Care were discussed  Natural trajectory and expectations at EOL were discussed.  Questions and concerns addressed.  Family encouraged to call with questions or  concerns.  PMT will continue to support holistically.  MOST form coompleted   SUMMARY OF RECOMMENDATIONS    Code Status/Advance Care Planning:  DNR    Symptom Management:   Pain/Dyspnea: Roxanol 5 mg po/sl every 2 hrs prn  Palliative Prophylaxis:   Aspiration, Bowel Regimen, Delirium Protocol and Oral Care  Additional Recommendations (Limitations, Scope, Preferences):  Full Comfort Care  Psycho-social/Spiritual:    Additional Recommendations: Education on Hospice  Prognosis:   < 6 months, shift to full comfort;  minimize medications, no antibiotics use for anticipated infections, avoid hospitalization  Discharge Planning: Home with Hospice      Primary Diagnoses: Present on Admission:  . Slurred speech  I have reviewed the medical record, interviewed the patient and family, and examined the patient. The following aspects are pertinent.  Past Medical History  Diagnosis Date  . Parkinson's disease (HCC)   . TIA (transient ischemic attack)   . A-fib (HCC)   . GERD (gastroesophageal reflux disease)    Social History   Social History  . Marital Status: Widowed    Spouse Name: N/A  . Number of Children: N/A  . Years of Education: N/A   Social History Main Topics  . Smoking status: Former Games developer  . Smokeless tobacco: None  .  Alcohol Use: None  . Drug Use: None  . Sexual Activity: Not Asked   Other Topics Concern  . None   Social History Narrative   History reviewed. No pertinent family history. Scheduled Meds: . carbidopa-levodopa  2 tablet Oral QHS  . carbidopa-levodopa  1 tablet Oral 5 times per day  . Carbidopa-Levodopa ER  1 tablet Oral 5 times per day   Continuous Infusions:  PRN Meds:.acetaminophen **OR** acetaminophen, LORazepam, ondansetron **OR** ondansetron (ZOFRAN) IV Medications Prior to Admission:  Prior to Admission medications   Medication Sig Start Date End Date Taking? Authorizing Provider  acetaminophen (TYLENOL ARTHRITIS  PAIN) 650 MG CR tablet Take 650 mg by mouth every 8 (eight) hours as needed for pain.   Yes Historical Provider, MD  apixaban (ELIQUIS) 5 MG TABS tablet Take 5 mg by mouth 2 (two) times daily.   Yes Historical Provider, MD  carbidopa-levodopa (SINEMET CR) 50-200 MG tablet Take 2 tablets by mouth at bedtime.   Yes Historical Provider, MD  carbidopa-levodopa (SINEMET IR) 25-250 MG tablet Take 1 tablet by mouth 5 (five) times daily.    Yes Historical Provider, MD  Carbidopa-Levodopa ER (SINEMET CR) 25-100 MG tablet controlled release Take 1 tablet by mouth 5 (five) times daily. This is to be taken with the 25-250 dose   Yes Historical Provider, MD  diltiazem (TIAZAC) 180 MG 24 hr capsule Take 180 mg by mouth daily.   Yes Historical Provider, MD  diphenhydramine-acetaminophen (TYLENOL PM) 25-500 MG TABS Take 1 tablet by mouth at bedtime as needed.   Yes Historical Provider, MD  fluticasone (FLONASE) 50 MCG/ACT nasal spray Place 1 spray into both nostrils daily.   Yes Historical Provider, MD  metoprolol tartrate (LOPRESSOR) 25 MG tablet Take 12.5 mg by mouth 2 (two) times daily.    Yes Historical Provider, MD  omeprazole (PRILOSEC) 20 MG capsule Take 20 mg by mouth daily.   Yes Historical Provider, MD  senna (SENOKOT) 8.6 MG tablet Take 1 tablet by mouth daily.   Yes Historical Provider, MD   No Known Allergies Review of Systems  Constitutional: Positive for fatigue.  Neurological: Positive for tremors and weakness.    Physical Exam  Constitutional: He appears well-developed. He appears lethargic. He appears ill.  HENT:  Mouth/Throat: Oropharynx is clear and moist.  Cardiovascular: Normal rate, regular rhythm and normal heart sounds.   Pulmonary/Chest: He has decreased breath sounds.  Neurological: He appears lethargic. He displays tremor. He exhibits abnormal muscle tone. Gait abnormal.  Skin: Skin is warm and dry.    Vital Signs: BP 115/65 mmHg  Pulse 68  Temp(Src) 98.3 F (36.8 C) (Oral)   Resp 16  Ht 5\' 9"  (1.753 m)  Wt 84.913 kg (187 lb 3.2 oz)  BMI 27.63 kg/m2  SpO2 94% Pain Assessment: No/denies pain       SpO2: SpO2: 94 % O2 Device:SpO2: 94 % O2 Flow Rate: .   IO: Intake/output summary:  Intake/Output Summary (Last 24 hours) at 05/03/16 0951 Last data filed at 05/02/16 1818  Gross per 24 hour  Intake    240 ml  Output      0 ml  Net    240 ml    LBM: Last BM Date: 05/02/16 Baseline Weight: Weight: 84.505 kg (186 lb 4.8 oz) Most recent weight: Weight: 84.913 kg (187 lb 3.2 oz)     Palliative Assessment/Data:   Discussed with DR Sherryll BurgerShah  Time In: 0930 Time Out: 1045 Time Total 75 min Greater  than 50%  of this time was spent counseling and coordinating care related to the above assessment and plan.  Signed by: Lorinda Creed, NP   Please contact Palliative Medicine Team phone at [phone] for questions and concerns.  For individual provider: See Loretha Stapler

## 2016-05-03 NOTE — Clinical Social Work Note (Signed)
CSW consulted for "lives by himself" and Comfort Care. Pt will return home with hospice following. RNCM if following for discharge planning needs. CSW is signing off as no further needs identified. Please reconsult if a need arises prior to discharge.   Dede QuerySarah Arelly Whittenberg, MSW, LCSW Clinical Social Worker  [phone]

## 2016-05-03 NOTE — Care Management Obs Status (Signed)
MEDICARE OBSERVATION STATUS NOTIFICATION   Patient Details  Name: Jon GreavesStephen F Payer MRN: [id number] Date of Birth: 04/11/1931   Medicare Observation Status Notification Given:  Yes    Naimah Yingst A, RN 05/03/2016, 8:36 AM

## 2016-05-03 NOTE — Progress Notes (Signed)
Subjective: Patient at baseline this AM.  No new neurological complaints.  Objective: Current vital signs: BP 115/65 mmHg  Pulse 68  Temp(Src) 98.3 F (36.8 C) (Oral)  Resp 16  Ht  (1.753 m)  Wt 84.913 kg (187 lb 3.2 oz)  BMI 27.63 kg/m2  SpO2 94% Vital signs in last 24 hours: Temp:  [97.4 F (36.3 C)-98.5 F (36.9 C)] 98.3 F (36.8 C) (07/18 0853) Pulse Rate:  [57-81] 68 (07/18 0457) Resp:  [16-20] 16 (07/18 0457) BP: (115-172)/(56-86) 115/65 mmHg (07/18 0853) SpO2:  [94 %-99 %] 94 % (07/18 0853) Weight:  [84.505 kg (186 lb 4.8 oz)-84.913 kg (187 lb 3.2 oz)] 84.913 kg (187 lb 3.2 oz) (07/18 0457)  Intake/Output from previous day: 07/17 0701 - 07/18 0700 In: 240 [P.O.:240] Out: -  Intake/Output this shift:   Nutritional status: Diet regular Room service appropriate?: Yes; Fluid consistency:: Thin  Neurologic Exam: Mental Status: Alert, oriented to name, place and year. Reports that it is August. Thought content appropriate. Speech fluent.  Dysarthric. Able to follow 3 step commands but requires reinforcement. Cranial Nerves: II: Discs flat bilaterally; Decreased right visual field, pupils equal, round, reactive to light and accommodation III,IV, VI: ptosis not present, extra-ocular motions intact bilaterally V,VII: smile symmetric, facial light touch sensation normal bilaterally VIII: hearing normal bilaterally IX,X: gag reflex present XI: bilateral shoulder shrug XII: midline tongue extension Motor: Right :Upper extremity 5/5Left: Upper extremity 5/5 Lower extremity 5/5Lower extremity 5/5 Dyskinesias noted  Lab Results: Basic Metabolic Panel:  Recent Labs Lab 05/02/16 1228 05/03/16 0516  NA 137 139  K 4.4 3.8  CL 107 108  CO2 20* 25  GLUCOSE 114* 97  BUN 28* 25*  CREATININE 1.03 0.77  CALCIUM 9.0 8.8*    Liver Function  Tests:  Recent Labs Lab 05/02/16 1228  AST 17  ALT <5*  ALKPHOS 86  BILITOT 1.1  PROT 6.9  ALBUMIN 4.0   No results for input(s): LIPASE, AMYLASE in the last 168 hours. No results for input(s): AMMONIA in the last 168 hours.  CBC:  Recent Labs Lab 05/02/16 1313 05/03/16 0516  WBC 10.7* 9.1  NEUTROABS 7.9*  --   HGB 16.1 16.1  HCT 47.1 46.0  MCV 95.9 94.2  PLT 203 180    Cardiac Enzymes:  Recent Labs Lab 05/02/16 1228  TROPONINI <0.03    Lipid Panel: No results for input(s): CHOL, TRIG, HDL, CHOLHDL, VLDL, LDLCALC in the last 168 hours.  CBG:  Recent Labs Lab 05/02/16 1228 05/03/16 0742  GLUCAP 124* 105*    Microbiology: No results found for this or any previous visit.  Coagulation Studies:  Recent Labs  05/02/16 1354  LABPROT 15.6*  INR 1.22    Imaging: Ct Head Wo Contrast  05/02/2016  CLINICAL DATA:  Stroke-like symptoms starting at 11:30 a.m. today. History of Parkinson's disease. EXAM: CT HEAD WITHOUT CONTRAST TECHNIQUE: Contiguous axial images were obtained from the base of the skull through the vertex without intravenous contrast. COMPARISON:  Head CT dated 02/03/2015. FINDINGS: Brain: Ventricles are stable in size and configuration. Mild generalized age related atrophy is stable with commensurate dilatation of the sulci. Mild chronic small vessel ischemic change again noted within the bilateral periventricular white matter. There is no mass, hemorrhage, edema or other evidence of acute parenchymal abnormality. No extra-axial hemorrhage. Vascular: No hyperdense vessel or unexpected calcification. There are chronic calcified atherosclerotic changes of the large vessels at the skull base. Skull: Negative for fracture or focal  lesion. Sinuses/Orbits: No acute findings. Other: None. IMPRESSION: No acute intracranial abnormality. No intracranial mass, hemorrhage or edema. Stable mild age-related atrophy and chronic small vessel ischemic changes in the  white matter. These results were called by telephone at the time of interpretation on 05/02/2016 at 12:26 pm to Dr. Jene EveryOBERT KINNER , who verbally acknowledged these results. Electronically Signed   By: Bary RichardStan  Maynard M.D.   On: 05/02/2016 12:29   Mr Brain Wo Contrast  05/02/2016  CLINICAL DATA:  Slurred speech.  Parkinson disease. EXAM: MRI HEAD WITHOUT CONTRAST TECHNIQUE: Multiplanar, multiecho pulse sequences of the brain and surrounding structures were obtained without intravenous contrast. COMPARISON:  Head CT 05/02/2016 and brain MRI 11/10/2014 FINDINGS: Brain Parenchyma: No acute infarct or intraparenchymal hemorrhage. Multifocal bilateral white matter hyperintensity compatible with chronic microvascular ischemia. No mass lesion or midline shift. The major intracranial flow voids are preserved. The midline structures are normal. Ventricles, Sulci and Extra-axial Spaces: Unchanged mild age related prominence of the ventricles. No extra-axial collection. Paranasal Sinuses and Mastoids: No fluid levels or advanced mucosal thickening. Orbits: Normal. Bones and Soft Tissues: The visualized skull base, calvarium and extracranial soft tissues are normal. IMPRESSION: 1. No acute intracranial abnormality. 2. Findings of chronic microvascular ischemia. Electronically Signed   By: Deatra RobinsonKevin  Herman M.D.   On: 05/02/2016 17:31   Dg Chest Portable 1 View  05/02/2016  CLINICAL DATA:  82 year old male with history of difficulty speaking and visual changes since 11:30 this morning. EXAM: PORTABLE CHEST 1 VIEW COMPARISON:  Chest x-ray 02/03/2015. FINDINGS: Lung volumes are low. Diffuse peribronchial cuffing. Ill-defined opacities in the left lung base may reflect areas of consolidation and/or atelectasis. Right lung appears clear. Small left pleural effusion. Crowding of the pulmonary vasculature, likely accentuated by low lung volumes, without frank pulmonary edema. Heart size is normal. The patient is rotated to the left on  today's exam, resulting in distortion of the mediastinal contours and reduced diagnostic sensitivity and specificity for mediastinal pathology. Atherosclerosis in the thoracic aorta. IMPRESSION: 1. Diffuse peribronchial cuffing, concerning for an acute bronchitis. 2. Atelectasis and/or airspace consolidation in the left lower lobe with small left pleural effusion. 3. Aortic atherosclerosis. Electronically Signed   By: Trudie Reedaniel  Entrikin M.D.   On: 05/02/2016 13:27    Medications:  I have reviewed the patient's current medications. Scheduled: . carbidopa-levodopa  2 tablet Oral QHS  . carbidopa-levodopa  1 tablet Oral 5 times per day  . Carbidopa-Levodopa ER  1 tablet Oral 5 times per day    Assessment/Plan: Patient at baseline.  Remains on Eliquis.  MRI of the brain personally reviewed and shows no acute changes.  Echocardiogram and carotid dopplers pending.  Lipid profile and A1c pending.    Recommendations: 1.  Agree with continued stroke work up.  Patient to remain on Eliquis and follow up with his outpatient neurologist at discharge.   2.  No further neurologic intervention is recommended at this time.  If further questions arise, please call or page at that time.  Thank you for allowing neurology to participate in the care of this patient.    Thana FarrLeslie Manan Olmo, MD Neurology [phone] 05/03/2016  11:15 AM

## 2016-05-03 NOTE — Progress Notes (Signed)
New referral for [facility] services following discharge received from [facility] following a  Palliative Medicine consult with Wadie Lessen NP. Patient is an 82 year old man with a history of Parkinson's, TIA, A fib and GERD. Patient was admitted on 7/17 for evaluation of slurred speech which had resolved after arrival at the ED. Neurology was consulted and no TPA was was recommended. Patient and his daughter have met with Palliative Medicine NP Wadie Lessen and chosen for patient to discontinue his anticoagulant medication as well as several other medications and return home with Hospice services. Writer met in the family room with patient's daughter Belenda Cruise to initiate education regarding hospice services, philosophy and team approach to care with good understanding voiced. She stated that her father has always wanted a "natural death", she feels it is only a Surveyor, quantity of time" before he has a "big stroke" and she wants to keep him at home. Katherine relayed Patient seen lying in bed, alert able to answer questions. Per Belenda Cruise he does well during the day, but needs a sitter at night d/t confusion and wandering. Appetite fair, does ambulate with a  Cane. No DME needs at this time. Patient information faxed to referral. Plan is for discharge today home by car. Portable DNR in place to accompany patient. Thank you. Flo Shanks RN, BSN, [facility], hospital Liaison [phone] c

## 2016-05-03 NOTE — Progress Notes (Signed)
PT Cancellation Note  Patient Details Name: Jon GreavesStephen F Down MRN: [id number] DOB: 27-Jun-1931   Cancelled Treatment:    Reason Eval/Treat Not Completed: Other (comment) (Consult received and chart reviewed.  Patient noted with plans for comfort care; initial PT order discontinued by physician. Please re-consult should needs or goals of care change.)  Waylen Depaolo H. Manson PasseyBrown, PT, DPT, NCS 05/03/2016, 10:49 AM [phone]

## 2016-05-05 NOTE — Discharge Summary (Addendum)
[facility] [place] [facility]   PATIENT NAME: Jon Mills    MR#:  [id number]  DATE OF BIRTH:  1930-11-19  DATE OF ADMISSION:  05/02/2016 ADMITTING PHYSICIAN: Katha Hamming, MD  DATE OF DISCHARGE: 05/03/2016  1:27 PM  PRIMARY CARE PHYSICIAN: SPARKS,JEFFREY D, MD    ADMISSION DIAGNOSIS:  Slurred speech [R47.81] Transient cerebral ischemia, unspecified transient cerebral ischemia type [G45.9]  DISCHARGE DIAGNOSIS:  Active Problems:   Slurred speech   Palliative care encounter   DNR (do not resuscitate)   Parkinson disease, symptomatic (HCC)  SECONDARY DIAGNOSIS:   Past Medical History  Diagnosis Date  . Parkinson's disease (HCC)   . TIA (transient ischemic attack)   . A-fib (HCC)   . GERD (gastroesophageal reflux disease)    HOSPITAL COURSE:  82 y.o. male admitted on 05/02/2016 essential hypertension brought in by the daughter because of slurred speech. By the time patient came to the emergency room patient's speech, clear. Seen by the neurologist did not recommend TPA because symptoms improved. He is alert awake oriented.   Considering progressive and worsening Parkinson disease Palliative care c/s was obtained. Per family decreasing po intake, 10 lb weight loss over past three months, increased needs with ADL, more unsteady gait  A detailed discussion was had regarding advanced directives. Concept of Hospice and Palliative Care were discussed and family chose hospice at home.  Patient was in agreement with the same.  #1 slurred speech resolved likely due to TIA; family chose comfort care and hospice 2. Bradycardia: comfort care and hospice #3 severe progressive Parkinson disease  4.possible left lower lobe pneumonia; not treated as family chose comfort care and hospice DISCHARGE CONDITIONS:  poor CONSULTS OBTAINED:  Treatment Team:  Kym Groom, MD  DRUG ALLERGIES:  No Known Allergies  DISCHARGE MEDICATIONS:    Discharge Medication List as of 05/03/2016  1:11 PM    START taking these medications   Details  LORazepam (ATIVAN) 1 MG tablet Take 1 tablet (1 mg total) by mouth every 8 (eight) hours as needed for anxiety., Starting 05/03/2016, Until Discontinued, Print    Morphine Sulfate (MORPHINE CONCENTRATE) 10 MG/0.5ML SOLN concentrated solution Take 0.25 mLs (5 mg total) by mouth every 2 (two) hours as needed for moderate pain, severe pain or shortness of breath., Starting 05/03/2016, Until Discontinued, Print      CONTINUE these medications which have NOT CHANGED   Details  carbidopa-levodopa (SINEMET CR) 50-200 MG tablet Take 2 tablets by mouth at bedtime., Until Discontinued, Historical Med    carbidopa-levodopa (SINEMET IR) 25-250 MG tablet Take 1 tablet by mouth 5 (five) times daily. , Until Discontinued, Historical Med    Carbidopa-Levodopa ER (SINEMET CR) 25-100 MG tablet controlled release Take 1 tablet by mouth 5 (five) times daily. This is to be taken with the 25-250 dose, Until Discontinued, Historical Med      STOP taking these medications     acetaminophen (TYLENOL ARTHRITIS PAIN) 650 MG CR tablet      apixaban (ELIQUIS) 5 MG TABS tablet      diltiazem (TIAZAC) 180 MG 24 hr capsule      diphenhydramine-acetaminophen (TYLENOL PM) 25-500 MG TABS      fluticasone (FLONASE) 50 MCG/ACT nasal spray      metoprolol tartrate (LOPRESSOR) 25 MG tablet      omeprazole (PRILOSEC) 20 MG capsule      senna (SENOKOT) 8.6 MG tablet        DISCHARGE INSTRUCTIONS:  DIET:  Regular diet  DISCHARGE CONDITION:  Good  ACTIVITY:  Activity as tolerated  OXYGEN:  Home Oxygen: No.   Oxygen Delivery: room air  DISCHARGE LOCATION:  home   If you experience worsening of your admission symptoms, develop shortness of breath, life threatening emergency, suicidal or homicidal thoughts you must seek medical attention immediately by calling 911 or calling your MD immediately  if  symptoms less severe.  You Must read complete instructions/literature along with all the possible adverse reactions/side effects for all the Medicines you take and that have been prescribed to you. Take any new Medicines after you have completely understood and accpet all the possible adverse reactions/side effects.   Please note  You were cared for by a hospitalist during your hospital stay. If you have any questions about your discharge medications or the care you received while you were in the hospital after you are discharged, you can call the unit and asked to speak with the hospitalist on call if the hospitalist that took care of you is not available. Once you are discharged, your primary care physician will handle any further medical issues. Please note that NO REFILLS for any discharge medications will be authorized once you are discharged, as it is imperative that you return to your primary care physician (or establish a relationship with a primary care physician if you do not have one) for your aftercare needs so that they can reassess your need for medications and monitor your lab values.    On the day of Discharge:  VITAL SIGNS:  Blood pressure 130/61, pulse 99, temperature 98.1 F (36.7 C), temperature source Oral, resp. rate 20, height 5\' 9"  (1.753 m), weight 84.913 kg (187 lb 3.2 oz), SpO2 97 %. PHYSICAL EXAMINATION:  GENERAL:  82 y.o.-year-old patient lying in the bed with no acute distress.  Constitutional: He appears well-developed. He appears lethargic. He appears ill.  HENT: Mouth/Throat: Oropharynx is clear and moist.  Cardiovascular: Normal rate, regular rhythm and normal heart sounds.  Pulmonary/Chest: He has decreased breath sounds.  Neurological: He appears lethargic. He displays tremor. He exhibits abnormal muscle tone.  Skin: Skin is warm and dry.  DATA REVIEW:   CBC  Recent Labs Lab 05/03/16 0516  WBC 9.1  HGB 16.1  HCT 46.0  PLT 180    Chemistries    Recent Labs Lab 05/02/16 1228 05/03/16 0516  NA 137 139  K 4.4 3.8  CL 107 108  CO2 20* 25  GLUCOSE 114* 97  BUN 28* 25*  CREATININE 1.03 0.77  CALCIUM 9.0 8.8*  AST 17  --   ALT <5*  --   ALKPHOS 86  --   BILITOT 1.1  --      Management plans discussed with the patient, family and they are in agreement.  CODE STATUS: DO NOT RESUSCITATE, comfort care and hospice at home  TOTAL TIME TAKING CARE OF THIS PATIENT: 45 minutes.    [facility]age[facility]H, Breella Vanostrand M.D on 05/05/2016 at 3:38 PM  Between 7am to 6pm - Pager - [phone]  After 6pm go to [email or web address] - password [email or web address] [facility]  [facility]  Office  [phone]  CC: Primary care physician; Marguarite ArbourSPARKS,JEFFREY D, MD   Note: This dictation was prepared with Dragon dictation along with smaller phrase technology. Any transcriptional errors that result from this process are unintentional.

## 2016-05-08 ENCOUNTER — Encounter: Payer: Self-pay | Admitting: Emergency Medicine

## 2016-05-08 ENCOUNTER — Other Ambulatory Visit: Payer: Self-pay

## 2016-05-08 ENCOUNTER — Emergency Department: Payer: Medicare Other

## 2016-05-08 ENCOUNTER — Emergency Department
Admission: EM | Admit: 2016-05-08 | Discharge: 2016-05-08 | Disposition: A | Discharge status: Home / Self Care | Payer: Medicare Other | Attending: Emergency Medicine | Admitting: Emergency Medicine

## 2016-05-08 DIAGNOSIS — R531 Weakness: Secondary | ICD-10-CM | POA: Diagnosis not present

## 2016-05-08 DIAGNOSIS — G20 Parkinson's disease: Secondary | ICD-10-CM | POA: Insufficient documentation

## 2016-05-08 DIAGNOSIS — I4891 Unspecified atrial fibrillation: Secondary | ICD-10-CM | POA: Diagnosis not present

## 2016-05-08 DIAGNOSIS — R29898 Other symptoms and signs involving the musculoskeletal system: Secondary | ICD-10-CM

## 2016-05-08 DIAGNOSIS — R410 Disorientation, unspecified: Secondary | ICD-10-CM | POA: Insufficient documentation

## 2016-05-08 DIAGNOSIS — Z8673 Personal history of transient ischemic attack (TIA), and cerebral infarction without residual deficits: Secondary | ICD-10-CM | POA: Diagnosis not present

## 2016-05-08 DIAGNOSIS — Z87891 Personal history of nicotine dependence: Secondary | ICD-10-CM | POA: Insufficient documentation

## 2016-05-08 DIAGNOSIS — R51 Headache: Secondary | ICD-10-CM | POA: Insufficient documentation

## 2016-05-08 LAB — GLUCOSE, CAPILLARY: GLUCOSE-CAPILLARY: 164 mg/dL — AB (ref 65–99)

## 2016-05-08 NOTE — ED Notes (Signed)
Patient to ED by daughter for episode of confusion after awaking from a nap. Daughter brought patient here for a CT of the head only. Does not want interventions at this time as they are trying to get patient into Hospice care. Patient is pleasant, and answers questions when asked.

## 2016-05-08 NOTE — ED Triage Notes (Signed)
Daughter [facility]) would only like CT and ekg done. Does nto want further work up. Just would like to see if find anything to qualify pt for hospice

## 2016-05-08 NOTE — Discharge Instructions (Signed)
The cause of your episode today is unclear. There are multiple possible causes that we did not further evaluate for due to your wish to not have further testing.   It is very important to see Dr. Judithann Sheen to have a conversation about your treatment goals. Please restart all of your medications today, and see Dr. Judithann Sheen tomorrow.  Please return to the emergency department for confusion, headache, numbness tingling or weakness, fever, or any other symptoms concerning to you.

## 2016-05-08 NOTE — ED Provider Notes (Signed)
[facility] Emergency Department Provider Note  ____________________________________________  Time seen: Approximately 8:13 PM  I have reviewed the triage vital signs and the nursing notes.   HISTORY  Chief Complaint Migraine    HPI Jon Mills is a 82 y.o. male with a history of A. fib and Parkinson's, recently hospitalized for altered mental status, presenting with confusion, bilateral leg weakness, and headache. The patient took a nap today, and when he awoke he was unable to perform some of his regular ADLs such as using the microwave or using his iPad. In addition he had bilateral leg weakness and was unable to walk. He describes a pressure sensation all over his head with some shooting pains behind his right eye. He denies any focal numbness or tingling, changes in speech, nausea or vomiting. This patient was recently hospitalized for altered mental status and there was a discussion about hospice, leading to his discontinuation of Eliquis and metoprolol, which he was using for A. Fib.     Past Medical History:  Diagnosis Date  . A-fib (HCC)   . GERD (gastroesophageal reflux disease)   . Parkinson's disease (HCC)   . TIA (transient ischemic attack)     Patient Active Problem List   Diagnosis Date Noted  . Palliative care encounter 05/03/2016  . DNR (do not resuscitate) 05/03/2016  . Parkinson disease, symptomatic (HCC) 05/03/2016  . Slurred speech 05/02/2016  . TIA (transient ischemic attack) 07/02/2015    History reviewed. No pertinent surgical history.  Current Outpatient Rx  . Order #: [id number] Class: Historical Med  . Order #: [id number] Class: Historical Med  . Order #: [id number] Class: Historical Med  . Order #: [id number] Class: Print  . Order #: [id number] Class: Print    Allergies Review of patient's allergies indicates no known allergies.  History reviewed. No pertinent family history.  Social History Social History   Substance Use Topics  . Smoking status: Former Games developer  . Smokeless tobacco: Not on file  . Alcohol use Not on file    Review of Systems Constitutional: No fever/chills. Eyes: No visual changes. ENT: No sore throat. No congestion or rhinorrhea. Cardiovascular: Denies chest pain. Denies palpitations. Respiratory: Denies shortness of breath.  No cough. Gastrointestinal: No abdominal pain.  No nausea, no vomiting.  No diarrhea.  No constipation. Genitourinary: Negative for dysuria. Musculoskeletal: Negative for back pain. Neuro: Positive confusion, bilateral lower extremity weakness. Positive headache Skin: Negative for rash. Neuro:  Psychiatric:  10-point ROS otherwise negative.  ____________________________________________   PHYSICAL EXAM:  VITAL SIGNS: ED Triage Vitals [05/08/16 1911]  Enc Vitals Group     BP (!) 202/84     Pulse Rate 93     Resp (!) 22     Temp 98 F (36.7 C)     Temp Source Oral     SpO2 99 %     Weight 186 lb (84.4 kg)     Height  (1.753 m)     Head Circumference      Peak Flow      Pain Score      Pain Loc      Pain Edu?      Excl. in GC?     Constitutional: Alert and oriented. Chronically ill-appearing but in no acute distress. Answers questions appropriately. Eyes: Conjunctivae are normal.  EOMI. No scleral icterus. PERRLA. Head: Atraumatic. Nose: No congestion/rhinnorhea. Mouth/Throat: Mucous membranes are moist.  Neck: No stridor.  Supple.  No meningismus. Cardiovascular: Irregular rate, regular rhythm. No  murmurs, rubs or gallops.  Respiratory: Normal respiratory effort.  No accessory muscle use or retractions. Lungs CTAB.  No wheezes, rales or ronchi. Gastrointestinal: Soft, nontender and nondistended.  No guarding or rebound.  No peritoneal signs. Musculoskeletal: No LE edema. No ttp in the calves or palpable cords.  Negative Homan's sign. Neurologic:   A&Ox3, patient has a resting tremor that is consistent with Parkinson's.  His speech is clear. Face is symmetric. EOMI and PERRLA. No pronator drift. 5 out of 5 bilateral grip, biceps, triceps, hip flexors, dorsiflexion and plantarflexion. Normal sensation to light touch in the face, upper extremities and lower extremities bilaterally. Skin:  Skin is warm, dry and intact. No rash noted. Psychiatric: Mood and affect are normal. Speech and behavior are normal.  Normal judgement.  ____________________________________________   LABS (all labs ordered are listed, but only abnormal results are displayed)  Labs Reviewed  GLUCOSE, CAPILLARY - Abnormal; Notable for the following:       Result Value   Glucose-Capillary 164 (*)    All other components within normal limits  PROTIME-INR  APTT  CBC  DIFFERENTIAL  COMPREHENSIVE METABOLIC PANEL  TROPONIN I  CBG MONITORING, ED   ____________________________________________  EKG  ED ECG REPORT I, Rockne Menghini, the attending physician, personally viewed and interpreted this ECG.   Date: 05/08/2016  EKG Time: 1858  Rate: 93  Rhythm: atrial fibrillation,   Axis: normal  Intervals:none  ST&T Change: No STEMI  ____________________________________________  RADIOLOGY  Ct Head Code Stroke W/o Cm  Result Date: 05/08/2016 CLINICAL DATA:  Code stroke. Code stroke. Right arm weakness. Right-sided headache. EXAM: CT HEAD WITHOUT CONTRAST TECHNIQUE: Contiguous axial images were obtained from the base of the skull through the vertex without intravenous contrast. COMPARISON:  MRI brain 05/02/2016. FINDINGS: No acute infarct, hemorrhage, or mass lesion is present. The ventricles are of normal size. No significant extraaxial fluid collection is present. Mild generalized atrophy is present. Periventricular white matter hypoattenuation bilaterally is stable. No acute hemorrhage or mass lesion is present. The paranasal sinuses mastoid air cells are clear. The globes and orbits are intact. No significant extracranial soft  tissue lesions are present. ASPECTS [facility] Stroke Program Early CT Score, [email or web address]) - Ganglionic level infarction (caudate, lentiform nuclei, internal capsule, insula, M1-M3 cortex): Intact - Supraganglionic infarction (M4-M6 cortex): Intact Total score (0-10): 10/10 IMPRESSION: 1. No acute intracranial abnormality or significant interval change. 2. Moderate atrophy and white matter disease likely reflects the sequela of chronic microvascular ischemia. This is stable. 3. ASPECTS score 10/10 These results were called by telephone at the time of interpretation on 05/08/2016 at 7:10 pm to Dr. Sharma Covert , who verbally acknowledged these results. Electronically Signed   By: Marin Roberts M.D.   On: 05/08/2016 19:11   ____________________________________________   PROCEDURES  Procedure(s) performed: None  Procedures  Critical Care performed: No ____________________________________________   INITIAL IMPRESSION / ASSESSMENT AND PLAN / ED COURSE  Pertinent labs & imaging results that were available during my care of the patient were reviewed by me and considered in my medical decision making (see chart for details).  82 y.o. male presenting with an episode of bilateral leg weakness, confusion, and headache. The patient's CT scan does not show any acute intracranial process, and his EKG shows A. fib. His blood sugar was normal.  I had a long discussion with the patient and his daughter, with whom he lives. At this time, they do not wish to have any further workup for his symptoms  and the patient feels that he is back to baseline. There is some confusion in regards to this patient's possible upcoming hospice status. I have talked with Dr. Judithann Sheen, who will see the family in the morning, and order any additional testing that is necessary, as well as clear up the confusion for the patient's hospice status.  The patient and his daughter both understand that without further  evaluation, I do not know the cause of his symptoms today. I have offered them the additional standard of care off rings for his symptoms, including MRI/MRA, urinalysis, basic laboratory studies, and possibly observation or admission. They have declined this and understand that he may have repeat symptoms, as well as worsening of his conditions. I am particularly concerned due to the fact that he has A. fib and has been off Eliquis, which the patient and the family understand.  Return precautions as well as follow-up instructions were discussed.  ____________________________________________  FINAL CLINICAL IMPRESSION(S) / ED DIAGNOSES  Final diagnoses:  Bilateral leg weakness  Confusion    Clinical Course      NEW MEDICATIONS STARTED DURING THIS VISIT:  New Prescriptions   No medications on file      Rockne Menghini, MD 05/08/16 2035

## 2016-05-08 NOTE — ED Triage Notes (Signed)
Per daughter pt took nap and woke at 1615 and was confused.  Per daughter confusion cleared. Pt c/o headache to right frontal and pressure behind right eye without vision changes. Right grip weaker than left. C/o difficulty feeling legs. Has had multiple TIA.

## 2016-09-16 DEATH — deceased

## 2016-12-12 IMAGING — MR MR HEAD W/O CM
10 series · 48 of 48 positions shown · non-contrast
Comparison: Head CT 05/02/2016 and brain MRI 11/10/2014

CLINICAL DATA: Slurred speech.  Parkinson disease.

EXAM:
MRI HEAD WITHOUT CONTRAST
TECHNIQUE: Multiplanar, multiecho pulse sequences of the brain and surrounding
structures were obtained without intravenous contrast.

[Series 3: GRE · sagittal · 5.0mm · 0.45mm/px · 5 of 31 slices shown (1 of 2)]
[im 1/31]
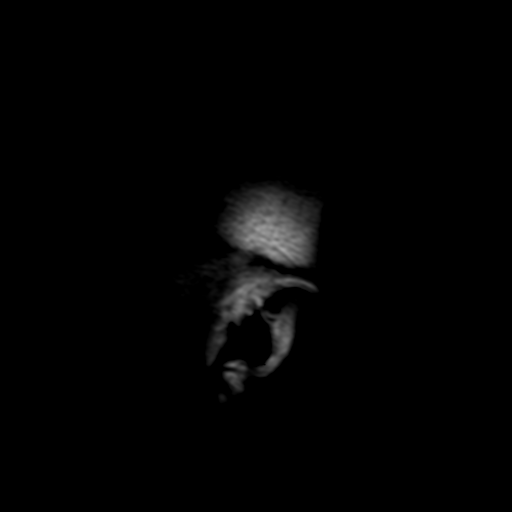
[im 8/31]
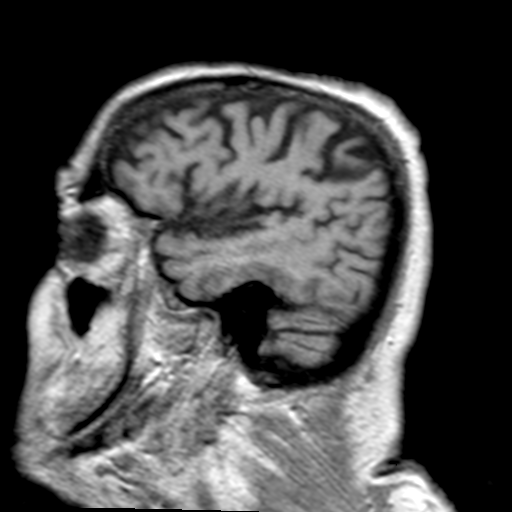
[im 16/31]
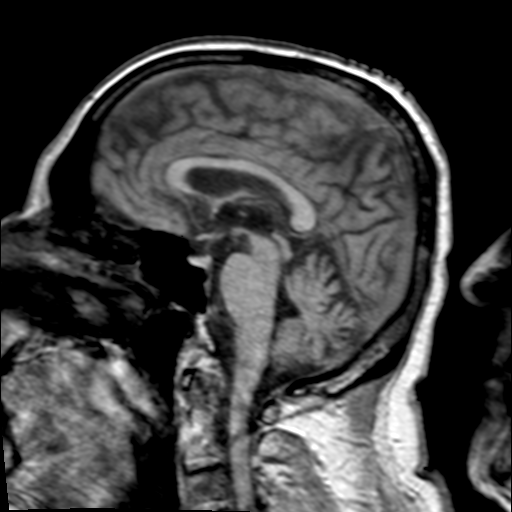
[im 23/31]
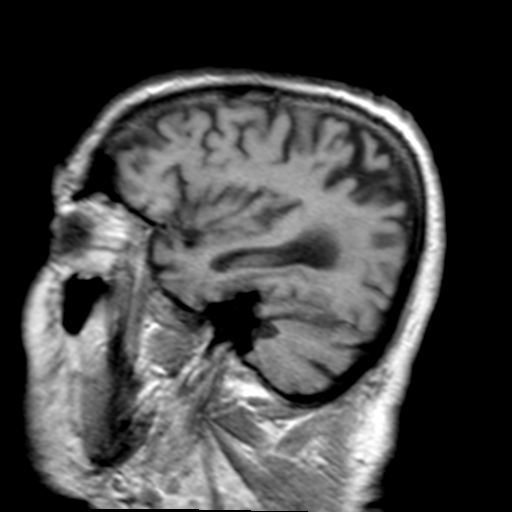
[im 31/31]
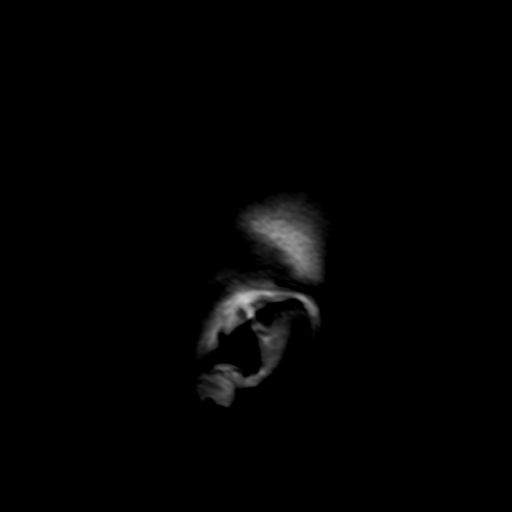

[Series 5: DWI · axial · 3.0mm · 1.80mm/px · z∈[-68,+98]mm · 8 of 59 slices shown (1 of 4)]
[im 1/59]
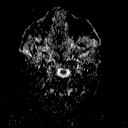
[im 9/59]
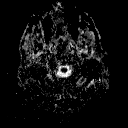
[im 17/59]
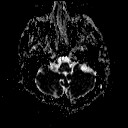
[im 25/59]
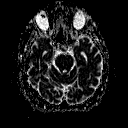
[im 34/59]
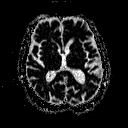
[im 42/59]
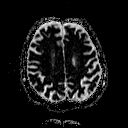
[im 50/59]
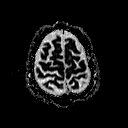
[im 59/59]
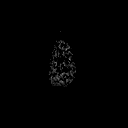

[Series 7: DWI · coronal · 3.0mm · 1.80mm/px · 6 of 48 slices shown (2 of 4)]
[im 1/48]
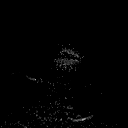
[im 10/48]
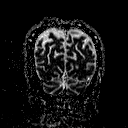
[im 19/48]
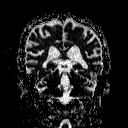
[im 29/48]
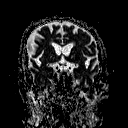
[im 38/48]
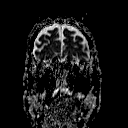
[im 48/48]
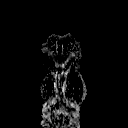

[Series 8: T2 · axial · 5.0mm · 0.90mm/px · z∈[-58,+104]mm · 3 of 26 slices shown (1 of 3)]
[im 1/26]
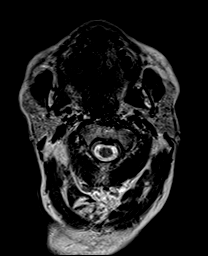
[im 13/26]
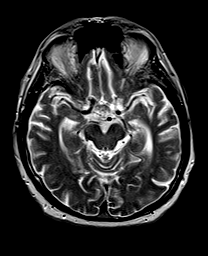
[im 26/26]
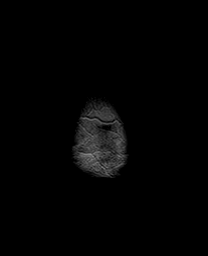

[Series 9: FLAIR · axial · 5.0mm · 0.45mm/px · z∈[-58,+104]mm · 3 of 27 slices shown]
[im 1/27]
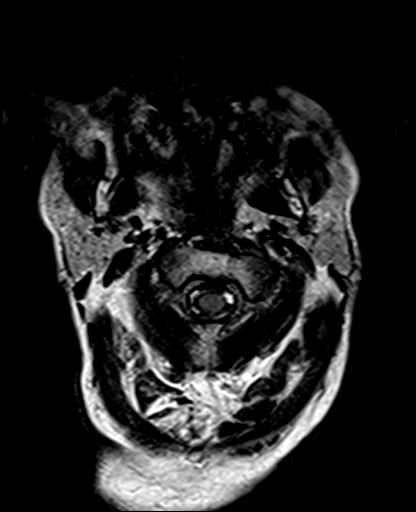
[im 14/27]
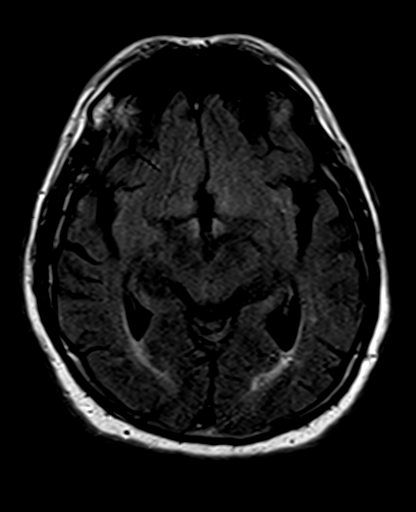
[im 27/27]
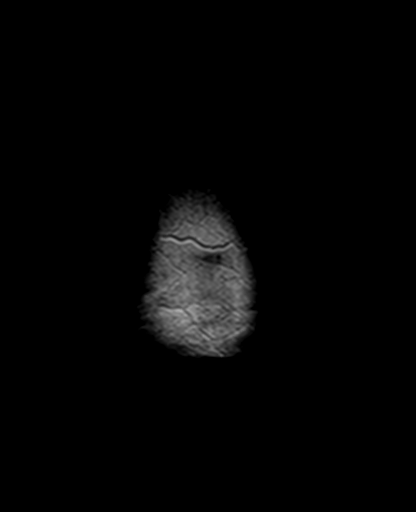

[Series 10: T2 · axial · 5.0mm · 0.45mm/px · z∈[-58,+104]mm · 3 of 27 slices shown (2 of 3)]
[im 1/27]
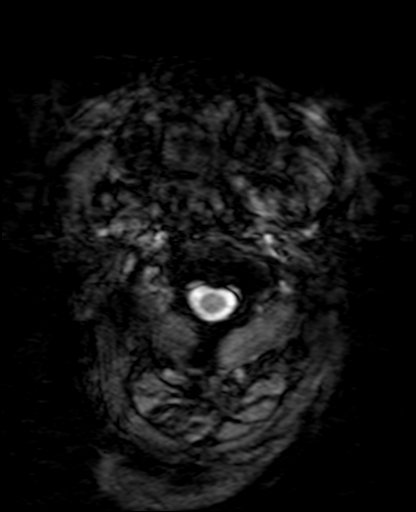
[im 14/27]
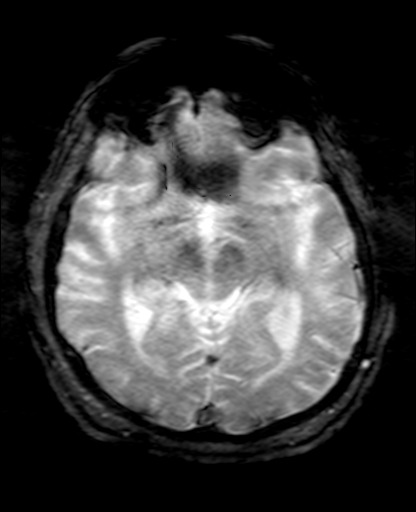
[im 27/27]
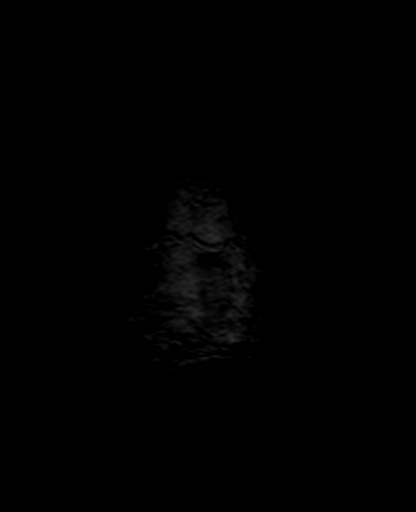

[Series 11: GRE · axial · 5.0mm · 0.45mm/px · z∈[-58,+104]mm · 3 of 27 slices shown (2 of 2)]
[im 1/27]
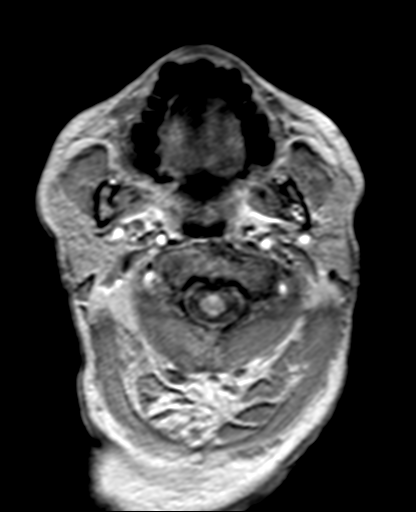
[im 14/27]
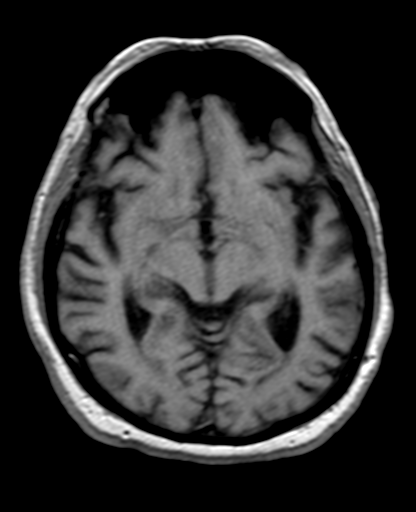
[im 27/27]
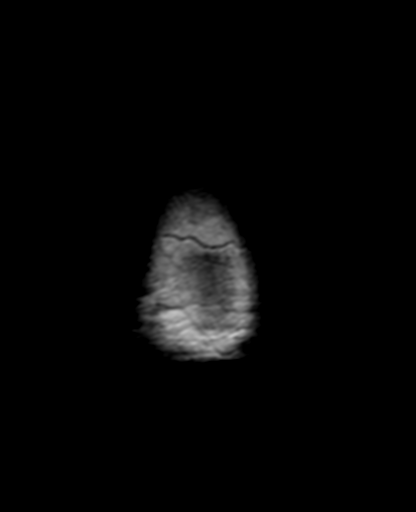

[Series 12: T2 · coronal · 5.0mm · 0.86mm/px · 4 of 31 slices shown (3 of 3)]
[im 1/31]
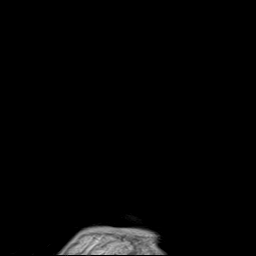
[im 11/31]
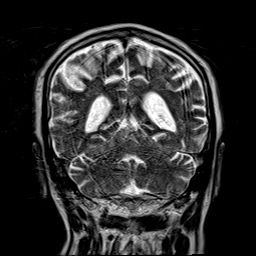
[im 21/31]
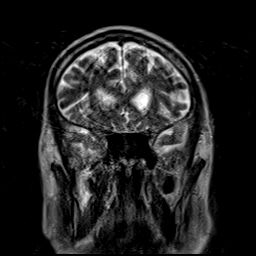
[im 31/31]
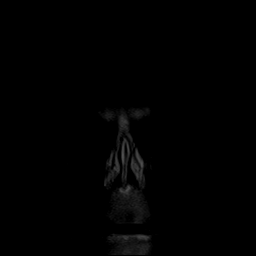

[Series 100: DWI · axial · 3.0mm · 1.80mm/px · z∈[-68,+98]mm · 7 of 59 slices shown (3 of 4)]
[im 1/59]
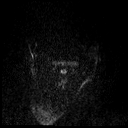
[im 10/59]
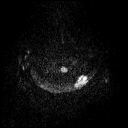
[im 20/59]
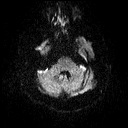
[im 30/59]
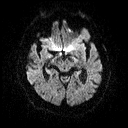
[im 39/59]
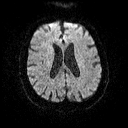
[im 49/59]
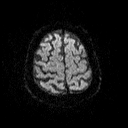
[im 59/59]
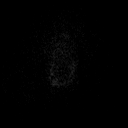

[Series 101: DWI · coronal · 3.0mm · 1.80mm/px · 6 of 48 slices shown (4 of 4)]
[im 1/48]
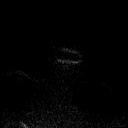
[im 10/48]
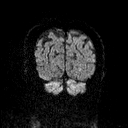
[im 19/48]
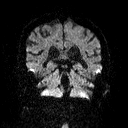
[im 29/48]
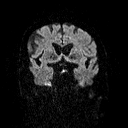
[im 38/48]
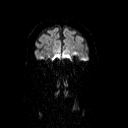
[im 48/48]
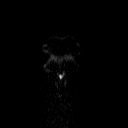

[48 of 48 positions shown; findings below may reference images not displayed]

FINDINGS: Brain Parenchyma: No acute infarct or intraparenchymal hemorrhage.
Multifocal bilateral white matter hyperintensity compatible with
chronic microvascular ischemia. No mass lesion or midline shift. The
major intracranial flow voids are preserved. The midline structures
are normal.

Ventricles, Sulci and Extra-axial Spaces: Unchanged mild age related
prominence of the ventricles. No extra-axial collection.

Paranasal Sinuses and Mastoids: No fluid levels or advanced mucosal
thickening.

Orbits: Normal.

Bones and Soft Tissues: The visualized skull base, calvarium and
extracranial soft tissues are normal.
IMPRESSION: 1. No acute intracranial abnormality.
2. Findings of chronic microvascular ischemia.
